# Patient Record
Sex: Female | Born: 1985 | Race: Asian | Hispanic: No | Marital: Married | Smoking: Never smoker
Health system: Southern US, Community
[De-identification: ages and names within clinical notes are randomized; demographics above are authoritative.]

## PROBLEM LIST (undated history)

## (undated) DIAGNOSIS — E785 Hyperlipidemia, unspecified: Secondary | ICD-10-CM

## (undated) DIAGNOSIS — E119 Type 2 diabetes mellitus without complications: Secondary | ICD-10-CM

## (undated) DIAGNOSIS — F419 Anxiety disorder, unspecified: Secondary | ICD-10-CM

## (undated) HISTORY — DX: Type 2 diabetes mellitus without complications: E11.9

## (undated) HISTORY — DX: Anxiety disorder, unspecified: F41.9

## (undated) HISTORY — DX: Hyperlipidemia, unspecified: E78.5

---

## 2017-08-08 ENCOUNTER — Encounter (HOSPITAL_COMMUNITY): Payer: Self-pay | Admitting: *Deleted

## 2017-08-08 ENCOUNTER — Emergency Department (HOSPITAL_COMMUNITY)
Admission: EM | Admit: 2017-08-08 | Discharge: 2017-08-08 | Disposition: A | Discharge status: Home / Self Care | Payer: Self-pay | Attending: Emergency Medicine | Admitting: Emergency Medicine

## 2017-08-08 ENCOUNTER — Emergency Department (HOSPITAL_COMMUNITY): Payer: Self-pay

## 2017-08-08 ENCOUNTER — Other Ambulatory Visit: Payer: Self-pay

## 2017-08-08 DIAGNOSIS — M7711 Lateral epicondylitis, right elbow: Secondary | ICD-10-CM | POA: Insufficient documentation

## 2017-08-08 DIAGNOSIS — R0602 Shortness of breath: Secondary | ICD-10-CM | POA: Insufficient documentation

## 2017-08-08 LAB — BASIC METABOLIC PANEL
ANION GAP: 10 (ref 5–15)
BUN: 13 mg/dL (ref 6–20)
CALCIUM: 9 mg/dL (ref 8.9–10.3)
CO2: 21 mmol/L — AB (ref 22–32)
Chloride: 105 mmol/L (ref 101–111)
Creatinine, Ser: 0.58 mg/dL (ref 0.44–1.00)
Glucose, Bld: 111 mg/dL — ABNORMAL HIGH (ref 65–99)
POTASSIUM: 4 mmol/L (ref 3.5–5.1)
Sodium: 136 mmol/L (ref 135–145)

## 2017-08-08 LAB — I-STAT TROPONIN, ED: TROPONIN I, POC: 0 ng/mL (ref 0.00–0.08)

## 2017-08-08 LAB — CBC
HCT: 44.3 % (ref 36.0–46.0)
Hemoglobin: 13.6 g/dL (ref 12.0–15.0)
MCH: 23.1 pg — ABNORMAL LOW (ref 26.0–34.0)
MCHC: 30.7 g/dL (ref 30.0–36.0)
MCV: 75.3 fL — ABNORMAL LOW (ref 78.0–100.0)
Platelets: 223 10*3/uL (ref 150–400)
RBC: 5.88 MIL/uL — AB (ref 3.87–5.11)
RDW: 15.5 % (ref 11.5–15.5)
WBC: 11.5 10*3/uL — ABNORMAL HIGH (ref 4.0–10.5)

## 2017-08-08 LAB — I-STAT BETA HCG BLOOD, ED (MC, WL, AP ONLY): I-stat hCG, quantitative: <5 m[IU]/mL (ref <5)

## 2017-08-08 MED ORDER — IBUPROFEN 600 MG PO TABS: 600.0000 mg | ORAL_TABLET | Freq: Four times a day (QID) | ORAL | 0 refills | Status: DC | PRN

## 2017-08-08 NOTE — ED Provider Notes (Signed)
[facility] Provider Note   CSN: [id number] Arrival date & time: 08/08/17  [id number]     History   Chief Complaint No chief complaint on file.   HPI Paula Delgado is a 32 y.o. female.  HPI   32 year old female presents today with complaints of right arm pain.  Patient reports she woke up in the middle night with pain to the right lateral elbow.  She notes pain is worse with movement of her wrist, she notes tingling in the hands, no loss of sensation strength or motor function.  She denies any trauma to the hand or upper extremity other than having to drag her sons place that from the car to the house.  She reports upper respiratory infection last week with minimal shortness of breath, no change since then, no productive cough fever or present chest pain.  Patient reports taking Tylenol prior to arrival.  History reviewed. No pertinent past medical history.  There are no active problems to display for this patient.   History reviewed. No pertinent surgical history.   OB History   None      Home Medications    Prior to Admission medications   Medication Sig Start Date End Date Taking? Authorizing Provider  ibuprofen (ADVIL,MOTRIN) 600 MG tablet Take 1 tablet (600 mg total) by mouth every 6 (six) hours as needed. 08/08/17   Eyvonne MechanicHedges, Dragan Tamburrino, PA-C    Family History No family history on file.  Social History Social History   Tobacco Use  . Smoking status: Never Smoker  . Smokeless tobacco: Never Used  Substance Use Topics  . Alcohol use: Yes  . Drug use: Never     Allergies   Patient has no known allergies.   Review of Systems Review of Systems  All other systems reviewed and are negative.    Physical Exam Updated Vital Signs BP 115/76   Pulse 83   Temp 98.9 F (37.2 C) (Oral)   Resp 20   Ht 5\' 2"  (1.575 m)   Wt 76.7 kg (169 lb)   SpO2 98%   BMI 30.91 kg/m   Physical Exam  Constitutional: She is oriented  to person, place, and time. She appears well-developed and well-nourished.  HENT:  Head: Normocephalic and atraumatic.  Eyes: Pupils are equal, round, and reactive to light. Conjunctivae are normal. Right eye exhibits no discharge. Left eye exhibits no discharge. No scleral icterus.  Neck: Normal range of motion. No JVD present. No tracheal deviation present.  Cardiovascular: Normal rate, regular rhythm, normal heart sounds and intact distal pulses. Exam reveals no gallop and no friction rub.  No murmur heard. Pulmonary/Chest: Effort normal and breath sounds normal. No stridor. No respiratory distress. She has no wheezes. She has no rales. She exhibits no tenderness.  Musculoskeletal:  Tenderness palpation of the proximal radius and lateral elbow pain worsened with wrist forearm supination no significant pain with pronation, grip strength 5 out of 5 radial pulse 2+ sensation intact  Neurological: She is alert and oriented to person, place, and time. Coordination normal.  Psychiatric: She has a normal mood and affect. Her behavior is normal. Judgment and thought content normal.  Nursing note and vitals reviewed.    ED Treatments / Results  Labs (all labs ordered are listed, but only abnormal results are displayed) Labs Reviewed  BASIC METABOLIC PANEL - Abnormal; Notable for the following components:      Result Value   CO2 21 (*)  Glucose, Bld 111 (*)    All other components within normal limits  CBC - Abnormal; Notable for the following components:   WBC 11.5 (*)    RBC 5.88 (*)    MCV 75.3 (*)    MCH 23.1 (*)    All other components within normal limits  I-STAT TROPONIN, ED  I-STAT BETA HCG BLOOD, ED ([facility], [facility], [facility] ONLY)    EKG None  Radiology Dg Chest 2 View  Result Date: 08/08/2017 CLINICAL DATA:  Shortness of breath.  Cold symptoms. EXAM: CHEST - 2 VIEW COMPARISON:  None. FINDINGS: The heart size and mediastinal contours are within normal limits. Both lungs are clear. The  visualized skeletal structures are unremarkable. IMPRESSION: No active cardiopulmonary disease. Electronically Signed   By: Elsie Stain M.D.   On: 08/08/2017 11:01    Procedures Procedures (including critical care time)  Medications Ordered in ED Medications - No data to display   Initial Impression / Assessment and Plan / ED Course  I have reviewed the triage vital signs and the nursing notes.  Pertinent labs & imaging results that were available during my care of the patient were reviewed by me and considered in my medical decision making (see chart for details).    Final Clinical Impressions(s) / ED Diagnoses   Final diagnoses:  Lateral epicondylitis of right elbow    Labs: I-STAT beta-hCG, i-STAT troponin, BMP, CBC  Imaging: DG chest 2 view, ED EKG no acute abnormalities  Consults:  Therapeutics:  Discharge Meds:   Assessment/Plan: 32 year old female presents today with likely lateral epicondylitis.  Patient with no signs of infectious etiology, no significant trauma.  Patient reported to nursing staff she had had episodes of shortness of breath, this is likely secondary to previous viral URI reassuring evaluation here negative chest x-ray oxygen 98%, afebrile with clear lung sounds.  Patient discharged with symptomatic care instructions and return precautions.  She verbalized understanding and agreement to today's plan.    ED Discharge Orders        Ordered    ibuprofen (ADVIL,MOTRIN) 600 MG tablet  Every 6 hours PRN     08/08/17 1320       Eyvonne Mechanic, PA-C 08/08/17 1320    Benjiman Core, MD 08/08/17 2212

## 2017-08-08 NOTE — ED Triage Notes (Signed)
Pt states here right arm has pain with movement since last night and states she took tylenol with no relieft.  Pt states last week her right chest and entire right side was hurting but did not go get checked out. Pt states went to [place] last week and states she had cold symptoms.  Pt reports sob. Pt states still has sob but no chest pain.  Right arm pain at lateral elbow and radiates down arm and has pain in right shoulder. No neuro deficits noted

## 2017-08-08 NOTE — Discharge Instructions (Signed)
Please read attached information. If you experience any new or worsening signs or symptoms please return to the emergency room for evaluation. Please follow-up with your primary care provider or specialist as discussed. Please use medication prescribed only as directed and discontinue taking if you have any concerning signs or symptoms.   °

## 2017-09-25 ENCOUNTER — Ambulatory Visit (INDEPENDENT_AMBULATORY_CARE_PROVIDER_SITE_OTHER): Payer: BLUE CROSS/BLUE SHIELD | Admitting: Obstetrics and Gynecology

## 2017-09-25 ENCOUNTER — Encounter: Payer: Self-pay | Admitting: Obstetrics and Gynecology

## 2017-09-25 VITALS — BP 118/78 | HR 72 | Ht 62.0 in | Wt 164.0 lb

## 2017-09-25 DIAGNOSIS — Z01419 Encounter for gynecological examination (general) (routine) without abnormal findings: Secondary | ICD-10-CM

## 2017-09-25 DIAGNOSIS — Z124 Encounter for screening for malignant neoplasm of cervix: Secondary | ICD-10-CM | POA: Diagnosis not present

## 2017-09-25 DIAGNOSIS — Z1151 Encounter for screening for human papillomavirus (HPV): Secondary | ICD-10-CM | POA: Diagnosis not present

## 2017-09-25 DIAGNOSIS — Z113 Encounter for screening for infections with a predominantly sexual mode of transmission: Secondary | ICD-10-CM | POA: Diagnosis not present

## 2017-09-25 NOTE — Patient Instructions (Signed)
Calorie Counting for Weight Loss Calories are units of energy. Your body needs a certain amount of calories from food to keep you going throughout the day. When you eat more calories than your body needs, your body stores the extra calories as fat. When you eat fewer calories than your body needs, your body burns fat to get the energy it needs. Calorie counting means keeping track of how many calories you eat and drink each day. Calorie counting can be helpful if you need to lose weight. If you make sure to eat fewer calories than your body needs, you should lose weight. Ask your health care provider what a healthy weight is for you. For calorie counting to work, you will need to eat the right number of calories in a day in order to lose a healthy amount of weight per week. A dietitian can help you determine how many calories you need in a day and will give you suggestions on how to reach your calorie goal.  A healthy amount of weight to lose per week is usually 1-2 lb (0.5-0.9 kg). This usually means that your daily calorie intake should be reduced by 500-750 calories.  Eating 1,200 - 1,500 calories per day can help most women lose weight.  Eating 1,500 - 1,800 calories per day can help most men lose weight.  What is my plan? My goal is to have __________ calories per day. If I have this many calories per day, I should lose around __________ pounds per week. What do I need to know about calorie counting? In order to meet your daily calorie goal, you will need to:  Find out how many calories are in each food you would like to eat. Try to do this before you eat.  Decide how much of the food you plan to eat.  Write down what you ate and how many calories it had. Doing this is called keeping a food log.  To successfully lose weight, it is important to balance calorie counting with a healthy lifestyle that includes regular activity. Aim for 150 minutes of moderate exercise (such as walking) or 75  minutes of vigorous exercise (such as running) each week. Where do I find calorie information?  The number of calories in a food can be found on a Nutrition Facts label. If a food does not have a Nutrition Facts label, try to look up the calories online or ask your dietitian for help. Remember that calories are listed per serving. If you choose to have more than one serving of a food, you will have to multiply the calories per serving by the amount of servings you plan to eat. For example, the label on a package of bread might say that a serving size is 1 slice and that there are 90 calories in a serving. If you eat 1 slice, you will have eaten 90 calories. If you eat 2 slices, you will have eaten 180 calories. How do I keep a food log? Immediately after each meal, record the following information in your food log:  What you ate. Don't forget to include toppings, sauces, and other extras on the food.  How much you ate. This can be measured in cups, ounces, or number of items.  How many calories each food and drink had.  The total number of calories in the meal.  Keep your food log near you, such as in a small notebook in your pocket, or use a mobile app or website. Some   programs will calculate calories for you and show you how many calories you have left for the day to meet your goal. What are some calorie counting tips?  Use your calories on foods and drinks that will fill you up and not leave you hungry: ? Some examples of foods that fill you up are nuts and nut butters, vegetables, lean proteins, and high-fiber foods like whole grains. High-fiber foods are foods with more than 5 g fiber per serving. ? Drinks such as sodas, specialty coffee drinks, alcohol, and juices have a lot of calories, yet do not fill you up.  Eat nutritious foods and avoid empty calories. Empty calories are calories you get from foods or beverages that do not have many vitamins or protein, such as candy, sweets, and  soda. It is better to have a nutritious high-calorie food (such as an avocado) than a food with few nutrients (such as a bag of chips).  Know how many calories are in the foods you eat most often. This will help you calculate calorie counts faster.  Pay attention to calories in drinks. Low-calorie drinks include water and unsweetened drinks.  Pay attention to nutrition labels for "low fat" or "fat free" foods. These foods sometimes have the same amount of calories or more calories than the full fat versions. They also often have added sugar, starch, or salt, to make up for flavor that was removed with the fat.  Find a way of tracking calories that works for you. Get creative. Try different apps or programs if writing down calories does not work for you. What are some portion control tips?  Know how many calories are in a serving. This will help you know how many servings of a certain food you can have.  Use a measuring cup to measure serving sizes. You could also try weighing out portions on a kitchen scale. With time, you will be able to estimate serving sizes for some foods.  Take some time to put servings of different foods on your favorite plates, bowls, and cups so you know what a serving looks like.  Try not to eat straight from a bag or box. Doing this can lead to overeating. Put the amount you would like to eat in a cup or on a plate to make sure you are eating the right portion.  Use smaller plates, glasses, and bowls to prevent overeating.  Try not to multitask (for example, watch TV or use your computer) while eating. If it is time to eat, sit down at a table and enjoy your food. This will help you to know when you are full. It will also help you to be aware of what you are eating and how much you are eating. What are tips for following this plan? Reading food labels  Check the calorie count compared to the serving size. The serving size may be smaller than what you are used to  eating.  Check the source of the calories. Make sure the food you are eating is high in vitamins and protein and low in saturated and trans fats. Shopping  Read nutrition labels while you shop. This will help you make healthy decisions before you decide to purchase your food.  Make a grocery list and stick to it. Cooking  Try to cook your favorite foods in a healthier way. For example, try baking instead of frying.  Use low-fat dairy products. Meal planning  Use more fruits and vegetables. Half of your plate should   be fruits and vegetables.  Include lean proteins like poultry and fish. How do I count calories when eating out?  Ask for smaller portion sizes.  Consider sharing an entree and sides instead of getting your own entree.  If you get your own entree, eat only half. Ask for a box at the beginning of your meal and put the rest of your entree in it so you are not tempted to eat it.  If calories are listed on the menu, choose the lower calorie options.  Choose dishes that include vegetables, fruits, whole grains, low-fat dairy products, and lean protein.  Choose items that are boiled, broiled, grilled, or steamed. Stay away from items that are buttered, battered, fried, or served with cream sauce. Items labeled "crispy" are usually fried, unless stated otherwise.  Choose water, low-fat milk, unsweetened iced tea, or other drinks without added sugar. If you want an alcoholic beverage, choose a lower calorie option such as a glass of wine or light beer.  Ask for dressings, sauces, and syrups on the side. These are usually high in calories, so you should limit the amount you eat.  If you want a salad, choose a garden salad and ask for grilled meats. Avoid extra toppings like bacon, cheese, or fried items. Ask for the dressing on the side, or ask for olive oil and vinegar or lemon to use as dressing.  Estimate how many servings of a food you are given. For example, a serving of  cooked rice is  cup or about the size of half a baseball. Knowing serving sizes will help you be aware of how much food you are eating at restaurants. The list below tells you how big or small some common portion sizes are based on everyday objects: ? 1 oz-4 stacked dice. ? 3 oz-1 deck of cards. ? 1 tsp-1 die. ? 1 Tbsp- a ping-pong ball. ? 2 Tbsp-1 ping-pong ball. ?  cup- baseball. ? 1 cup-1 baseball. Summary  Calorie counting means keeping track of how many calories you eat and drink each day. If you eat fewer calories than your body needs, you should lose weight.  A healthy amount of weight to lose per week is usually 1-2 lb (0.5-0.9 kg). This usually means reducing your daily calorie intake by 500-750 calories.  The number of calories in a food can be found on a Nutrition Facts label. If a food does not have a Nutrition Facts label, try to look up the calories online or ask your dietitian for help.  Use your calories on foods and drinks that will fill you up, and not on foods and drinks that will leave you hungry.  Use smaller plates, glasses, and bowls to prevent overeating. This information is not intended to replace advice given to you by your health care provider. Make sure you discuss any questions you have with your health care provider. Document Released: 05/02/2005 Document Revised: 04/01/2016 Document Reviewed: 04/01/2016 Elsevier Interactive Patient Education  2018 [employer]  Exercising to Lose Weight Exercising can help you to lose weight. In order to lose weight through exercise, you need to do vigorous-intensity exercise. You can tell that you are exercising with vigorous intensity if you are breathing very hard and fast and cannot hold a conversation while exercising. Moderate-intensity exercise helps to maintain your current weight. You can tell that you are exercising at a moderate level if you have a higher heart rate and faster breathing, but you are still able  to   hold a conversation. How often should I exercise? Choose an activity that you enjoy and set realistic goals. Your health care provider can help you to make an activity plan that works for you. Exercise regularly as directed by your health care provider. This may include:  Doing resistance training twice each week, such as: ? Push-ups. ? Sit-ups. ? Lifting weights. ? Using resistance bands.  Doing a given intensity of exercise for a given amount of time. Choose from these options: ? 150 minutes of moderate-intensity exercise every week. ? 75 minutes of vigorous-intensity exercise every week. ? A mix of moderate-intensity and vigorous-intensity exercise every week.  Children, pregnant women, people who are out of shape, people who are overweight, and older adults may need to consult a health care provider for individual recommendations. If you have any sort of medical condition, be sure to consult your health care provider before starting a new exercise program. What are some activities that can help me to lose weight?  Walking at a rate of at least 4.5 miles an hour.  Jogging or running at a rate of 5 miles per hour.  Biking at a rate of at least 10 miles per hour.  Lap swimming.  Roller-skating or in-line skating.  Cross-country skiing.  Vigorous competitive sports, such as football, basketball, and soccer.  Jumping rope.  Aerobic dancing. How can I be more active in my day-to-day activities?  Use the stairs instead of the elevator.  Take a walk during your lunch break.  If you drive, park your car farther away from work or school.  If you take public transportation, get off one stop early and walk the rest of the way.  Make all of your phone calls while standing up and walking around.  Get up, stretch, and walk around every 30 minutes throughout the day. What guidelines should I follow while exercising?  Do not exercise so much that you hurt yourself, feel dizzy,  or get very short of breath.  Consult your health care provider prior to starting a new exercise program.  Wear comfortable clothes and shoes with good support.  Drink plenty of water while you exercise to prevent dehydration or heat stroke. Body water is lost during exercise and must be replaced.  Work out until you breathe faster and your heart beats faster. This information is not intended to replace advice given to you by your health care provider. Make sure you discuss any questions you have with your health care provider. Document Released: 06/04/2010 Document Revised: 10/08/2015 Document Reviewed: 10/03/2013 Elsevier Interactive Patient Education  2018 [employer] Inc.  

## 2017-09-25 NOTE — Progress Notes (Signed)
Pt is in the office for annual exam, currently has Mirena IUD, place in 2018. Pt desires std testing.

## 2017-09-25 NOTE — Progress Notes (Signed)
Subjective:     Paula Delgado is a 32 y.o. female G2P0111 with BMI 30 and Mirena induced amneorrhea (inserted in 2018) who is here for a comprehensive physical exam. The patient reports no problems. Patient is sexually active without complaints. She denies any pelvic pain or abnormal discharge.  History reviewed. No pertinent past medical history. History reviewed. No pertinent surgical history. Family History  Problem Relation Age of Onset  . Diabetes Mother     Social History   Socioeconomic History  . Marital status: Single    Spouse name: Not on file  . Number of children: Not on file  . Years of education: Not on file  . Highest education level: Not on file  Occupational History  . Not on file  Social Needs  . Financial resource strain: Not on file  . Food insecurity:    Worry: Not on file    Inability: Not on file  . Transportation needs:    Medical: Not on file    Non-medical: Not on file  Tobacco Use  . Smoking status: Former Smoker    Types: Cigarettes  . Smokeless tobacco: Never Used  Substance and Sexual Activity  . Alcohol use: Yes  . Drug use: Never  . Sexual activity: Yes    Birth control/protection: IUD  Lifestyle  . Physical activity:    Days per week: Not on file    Minutes per session: Not on file  . Stress: Not on file  Relationships  . Social connections:    Talks on phone: Not on file    Gets together: Not on file    Attends religious service: Not on file    Active member of club or organization: Not on file    Attends meetings of clubs or organizations: Not on file    Relationship status: Not on file  . Intimate partner violence:    Fear of current or ex partner: Not on file    Emotionally abused: Not on file    Physically abused: Not on file    Forced sexual activity: Not on file  Other Topics Concern  . Not on file  Social History Narrative  . Not on file   Health Maintenance  Topic Date Due  . HIV Screening  02/15/1999  .  TETANUS/TDAP  02/15/2003  . PAP SMEAR  02/14/2005  . INFLUENZA VACCINE  12/14/2017       Review of Systems Pertinent items are noted in HPI.   Objective:  Blood pressure 118/78, pulse 72, height  (1.575 m), weight 164 lb (74.4 kg).     GENERAL: Well-developed, well-nourished female in no acute distress.  HEENT: Normocephalic, atraumatic. Sclerae anicteric.  NECK: Supple. Normal thyroid.  LUNGS: Clear to auscultation bilaterally.  HEART: Regular rate and rhythm. BREASTS: Symmetric in size. No palpable masses or lymphadenopathy, skin changes, or nipple drainage. ABDOMEN: Soft, nontender, nondistended. No organomegaly. PELVIC: Normal external female genitalia. Vagina is pink and rugated.  Normal discharge. Normal appearing cervix with IUD strings visualized from os. Uterus is normal in size. No adnexal mass or tenderness. EXTREMITIES: No cyanosis, clubbing, or edema, 2+ distal pulses.    Assessment:    Healthy female exam.      Plan:    Pap smear and STI screen performed Health maintenance labs ordered Patient will be contacted with abnormal results Discussed weight loss strategies See After Visit Summary for Counseling Recommendations

## 2017-09-26 LAB — CBC
HEMATOCRIT: 41.6 % (ref 34.0–46.6)
Hemoglobin: 12.6 g/dL (ref 11.1–15.9)
MCH: 23.9 pg — ABNORMAL LOW (ref 26.6–33.0)
MCHC: 30.3 g/dL — ABNORMAL LOW (ref 31.5–35.7)
MCV: 79 fL (ref 79–97)
PLATELETS: 270 10*3/uL (ref 150–379)
RBC: 5.28 x10E6/uL (ref 3.77–5.28)
RDW: 15.6 % — ABNORMAL HIGH (ref 12.3–15.4)
WBC: 6.4 10*3/uL (ref 3.4–10.8)

## 2017-09-26 LAB — COMPREHENSIVE METABOLIC PANEL
A/G RATIO: 1.7 (ref 1.2–2.2)
ALT: 16 IU/L (ref 0–32)
AST: 18 IU/L (ref 0–40)
Albumin: 4.8 g/dL (ref 3.5–5.5)
Alkaline Phosphatase: 56 IU/L (ref 39–117)
BUN/Creatinine Ratio: 19 (ref 9–23)
BUN: 15 mg/dL (ref 6–20)
Bilirubin Total: <0.2 mg/dL (ref 0.0–1.2)
CALCIUM: 9.6 mg/dL (ref 8.7–10.2)
CO2: 24 mmol/L (ref 20–29)
CREATININE: 0.77 mg/dL (ref 0.57–1.00)
Chloride: 102 mmol/L (ref 96–106)
GFR calc Af Amer: 117 mL/min/{1.73_m2} (ref >59)
GFR calc non Af Amer: 102 mL/min/{1.73_m2} (ref >59)
Globulin, Total: 2.8 g/dL (ref 1.5–4.5)
Glucose: 101 mg/dL — ABNORMAL HIGH (ref 65–99)
POTASSIUM: 4.3 mmol/L (ref 3.5–5.2)
Sodium: 141 mmol/L (ref 134–144)
TOTAL PROTEIN: 7.6 g/dL (ref 6.0–8.5)

## 2017-09-26 LAB — HEPATITIS B SURFACE ANTIGEN: Hepatitis B Surface Ag: NEGATIVE

## 2017-09-26 LAB — HEMOGLOBIN A1C
Est. average glucose Bld gHb Est-mCnc: 143 mg/dL
Hgb A1c MFr Bld: 6.6 % — ABNORMAL HIGH (ref 4.8–5.6)

## 2017-09-26 LAB — RPR: RPR: NONREACTIVE

## 2017-09-26 LAB — HEPATITIS C ANTIBODY: Hep C Virus Ab: <0.1 s/co ratio (ref 0.0–0.9)

## 2017-09-26 LAB — TSH: TSH: 1.29 u[IU]/mL (ref 0.450–4.500)

## 2017-09-26 LAB — HIV ANTIBODY (ROUTINE TESTING W REFLEX): HIV Screen 4th Generation wRfx: NONREACTIVE

## 2017-09-27 LAB — CYTOLOGY - PAP
Candida vaginitis: NEGATIVE
Chlamydia: NEGATIVE
Diagnosis: NEGATIVE
HPV (WINDOPATH): NOT DETECTED
Neisseria Gonorrhea: NEGATIVE
TRICH (WINDOWPATH): NEGATIVE

## 2019-03-06 ENCOUNTER — Other Ambulatory Visit: Payer: Self-pay

## 2019-03-06 ENCOUNTER — Encounter: Payer: Self-pay | Admitting: Family Medicine

## 2019-03-06 ENCOUNTER — Ambulatory Visit (INDEPENDENT_AMBULATORY_CARE_PROVIDER_SITE_OTHER): Payer: BLUE CROSS/BLUE SHIELD | Admitting: Family Medicine

## 2019-03-06 VITALS — BP 128/80 | HR 61 | Temp 98.1°F | Ht 62.0 in | Wt 161.0 lb

## 2019-03-06 DIAGNOSIS — Z0000 Encounter for general adult medical examination without abnormal findings: Secondary | ICD-10-CM

## 2019-03-06 DIAGNOSIS — Z23 Encounter for immunization: Secondary | ICD-10-CM

## 2019-03-06 DIAGNOSIS — Z8632 Personal history of gestational diabetes: Secondary | ICD-10-CM

## 2019-03-06 LAB — CBC
HCT: 44.3 % (ref 36.0–46.0)
Hemoglobin: 13.8 g/dL (ref 12.0–15.0)
MCHC: 31.2 g/dL (ref 30.0–36.0)
MCV: 81.6 fl (ref 78.0–100.0)
Platelets: 223 10*3/uL (ref 150.0–400.0)
RBC: 5.43 Mil/uL — ABNORMAL HIGH (ref 3.87–5.11)
RDW: 14.5 % (ref 11.5–15.5)
WBC: 6.6 10*3/uL (ref 4.0–10.5)

## 2019-03-06 LAB — BASIC METABOLIC PANEL
BUN: 17 mg/dL (ref 6–23)
CO2: 28 mEq/L (ref 19–32)
Calcium: 9.5 mg/dL (ref 8.4–10.5)
Chloride: 103 mEq/L (ref 96–112)
Creatinine, Ser: 0.61 mg/dL (ref 0.40–1.20)
GFR: 111.58 mL/min (ref >60.00)
Glucose, Bld: 113 mg/dL — ABNORMAL HIGH (ref 70–99)
Potassium: 4.3 mEq/L (ref 3.5–5.1)
Sodium: 139 mEq/L (ref 135–145)

## 2019-03-06 LAB — LIPID PANEL
Cholesterol: 196 mg/dL (ref 0–200)
HDL: 53.5 mg/dL (ref >39.00)
LDL Cholesterol: 129 mg/dL — ABNORMAL HIGH (ref 0–99)
NonHDL: 142.07
Total CHOL/HDL Ratio: 4
Triglycerides: 65 mg/dL (ref 0.0–149.0)
VLDL: 13 mg/dL (ref 0.0–40.0)

## 2019-03-06 LAB — ALT: ALT: 13 U/L (ref 0–35)

## 2019-03-06 LAB — AST: AST: 16 U/L (ref 0–37)

## 2019-03-06 LAB — HEMOGLOBIN A1C: Hgb A1c MFr Bld: 6.7 % — ABNORMAL HIGH (ref 4.6–6.5)

## 2019-03-06 NOTE — Patient Instructions (Signed)
Health Maintenance, Female Adopting a healthy lifestyle and getting preventive care are important in promoting health and wellness. Ask your health care provider about:  The right schedule for you to have regular tests and exams.  Things you can do on your own to prevent diseases and keep yourself healthy. What should I know about diet, weight, and exercise? Eat a healthy diet   Eat a diet that includes plenty of vegetables, fruits, low-fat dairy products, and lean protein.  Do not eat a lot of foods that are high in solid fats, added sugars, or sodium. Maintain a healthy weight Body mass index (BMI) is used to identify weight problems. It estimates body fat based on height and weight. Your health care provider can help determine your BMI and help you achieve or maintain a healthy weight. Get regular exercise Get regular exercise. This is one of the most important things you can do for your health. Most adults should:  Exercise for at least 150 minutes each week. The exercise should increase your heart rate and make you sweat (moderate-intensity exercise).  Do strengthening exercises at least twice a week. This is in addition to the moderate-intensity exercise.  Spend less time sitting. Even light physical activity can be beneficial. Watch cholesterol and blood lipids Have your blood tested for lipids and cholesterol at 35 years of age, then have this test every 5 years. Have your cholesterol levels checked more often if:  Your lipid or cholesterol levels are high.  You are older than 35 years of age.  You are at high risk for heart disease. What should I know about cancer screening? Depending on your health history and family history, you may need to have cancer screening at various ages. This may include screening for:  Breast cancer.  Cervical cancer.  Colorectal cancer.  Skin cancer.  Lung cancer. What should I know about heart disease, diabetes, and high blood  pressure? Blood pressure and heart disease  High blood pressure causes heart disease and increases the risk of stroke. This is more likely to develop in people who have high blood pressure readings, are of African descent, or are overweight.  Have your blood pressure checked: ? Every 3-5 years if you are 18-39 years of age. ? Every year if you are 40 years old or older. Diabetes Have regular diabetes screenings. This checks your fasting blood sugar level. Have the screening done:  Once every three years after age 40 if you are at a normal weight and have a low risk for diabetes.  More often and at a younger age if you are overweight or have a high risk for diabetes. What should I know about preventing infection? Hepatitis B If you have a higher risk for hepatitis B, you should be screened for this virus. Talk with your health care provider to find out if you are at risk for hepatitis B infection. Hepatitis C Testing is recommended for:  Everyone born from 1945 through 1965.  Anyone with known risk factors for hepatitis C. Sexually transmitted infections (STIs)  Get screened for STIs, including gonorrhea and chlamydia, if: ? You are sexually active and are younger than 35 years of age. ? You are older than 35 years of age and your health care provider tells you that you are at risk for this type of infection. ? Your sexual activity has changed since you were last screened, and you are at increased risk for chlamydia or gonorrhea. Ask your health care provider if   you are at risk.  Ask your health care provider about whether you are at high risk for HIV. Your health care provider may recommend a prescription medicine to help prevent HIV infection. If you choose to take medicine to prevent HIV, you should first get tested for HIV. You should then be tested every 3 months for as long as you are taking the medicine. Pregnancy  If you are about to stop having your period (premenopausal) and  you may become pregnant, seek counseling before you get pregnant.  Take 400 to 800 micrograms (mcg) of folic acid every day if you become pregnant.  Ask for birth control (contraception) if you want to prevent pregnancy. Osteoporosis and menopause Osteoporosis is a disease in which the bones lose minerals and strength with aging. This can result in bone fractures. If you are 65 years old or older, or if you are at risk for osteoporosis and fractures, ask your health care provider if you should:  Be screened for bone loss.  Take a calcium or vitamin D supplement to lower your risk of fractures.  Be given hormone replacement therapy (HRT) to treat symptoms of menopause. Follow these instructions at home: Lifestyle  Do not use any products that contain nicotine or tobacco, such as cigarettes, e-cigarettes, and chewing tobacco. If you need help quitting, ask your health care provider.  Do not use street drugs.  Do not share needles.  Ask your health care provider for help if you need support or information about quitting drugs. Alcohol use  Do not drink alcohol if: ? Your health care provider tells you not to drink. ? You are pregnant, may be pregnant, or are planning to become pregnant.  If you drink alcohol: ? Limit how much you use to 0-1 drink a day. ? Limit intake if you are breastfeeding.  Be aware of how much alcohol is in your drink. In the [place] one drink equals one 12 oz bottle of beer (355 mL), one 5 oz glass of wine (148 mL), or one 1 oz glass of hard liquor (44 mL). General instructions  Schedule regular health, dental, and eye exams.  Stay current with your vaccines.  Tell your health care provider if: ? You often feel depressed. ? You have ever been abused or do not feel safe at home. Summary  Adopting a healthy lifestyle and getting preventive care are important in promoting health and wellness.  Follow your health care provider's instructions about healthy  diet, exercising, and getting tested or screened for diseases.  Follow your health care provider's instructions on monitoring your cholesterol and blood pressure. This information is not intended to replace advice given to you by your health care provider. Make sure you discuss any questions you have with your health care provider. Document Released: 11/15/2010 Document Revised: 04/25/2018 Document Reviewed: 04/25/2018 Elsevier Patient Education  2020 Elsevier Inc.  

## 2019-03-06 NOTE — Progress Notes (Signed)
Paula Delgado is a 33 y.o. female  Chief Complaint  Patient presents with  . Establish Care  . Annual Exam    HPI: Paula Delgado is a 33 y.o. female here to establish care and for annual CPE, fasting labs. She has a 40 year old son. She moved to [facility] from [facility] 2 years ago but has not had PCP here.  Pt states she was on metformin during pregnancy (2 years ago) but normal BS post delivery.  She would like a flu vaccine today.  Last PAP: UTD - follows with GYN Dr. Mora Bellman  Diet/Exercise: some exercise, diet varies Vision - UTD on exam Dentist - due  Med refills needed today? none   History reviewed. No pertinent past medical history.  History reviewed. No pertinent surgical history.  Social History   Socioeconomic History  . Marital status: Single    Spouse name: Not on file  . Number of children: Not on file  . Years of education: Not on file  . Highest education level: Not on file  Occupational History  . Not on file  Social Needs  . Financial resource strain: Not on file  . Food insecurity    Worry: Not on file    Inability: Not on file  . Transportation needs    Medical: Not on file    Non-medical: Not on file  Tobacco Use  . Smoking status: Former Smoker    Types: Cigarettes  . Smokeless tobacco: Never Used  Substance and Sexual Activity  . Alcohol use: Yes  . Drug use: Never  . Sexual activity: Yes    Birth control/protection: I.U.D.  Lifestyle  . Physical activity    Days per week: Not on file    Minutes per session: Not on file  . Stress: Not on file  Relationships  . Social Herbalist on phone: Not on file    Gets together: Not on file    Attends religious service: Not on file    Active member of club or organization: Not on file    Attends meetings of clubs or organizations: Not on file    Relationship status: Not on file  . Intimate partner violence    Fear of current or ex partner: Not on file    Emotionally  abused: Not on file    Physically abused: Not on file    Forced sexual activity: Not on file  Other Topics Concern  . Not on file  Social History Narrative  . Not on file    Family History  Problem Relation Age of Onset  . Diabetes Mother       There is no immunization history on file for this patient.  Outpatient Encounter Medications as of 03/06/2019  Medication Sig  . [DISCONTINUED] ibuprofen (ADVIL,MOTRIN) 600 MG tablet Take 1 tablet (600 mg total) by mouth every 6 (six) hours as needed. (Patient not taking: Reported on 09/25/2017)   No facility-administered encounter medications on file as of 03/06/2019.      ROS: Gen: no fever, chills  Skin: no rash, itching ENT: no ear pain, ear drainage, nasal congestion, rhinorrhea, sinus pressure, sore throat Eyes: no blurry vision, double vision Resp: no cough, wheeze,SOB Breast: no breast tenderness, no nipple discharge, no breast masses CV: no CP, palpitations, LE edema,  GI: no heartburn, n/v/d/c, abd pain GU: no dysuria, urgency, frequency, hematuria; no vaginal itching, odor, discharge MSK: no joint pain, myalgias, back pain Neuro: no dizziness,  headache, weakness, vertigo Psych: no depression, anxiety, insomnia   No Known Allergies  BP 128/80   Pulse 61   Temp 98.1 F (36.7 C) (Tympanic)   Ht 5\' 2"  (1.575 m)   Wt 161 lb (73 kg)   SpO2 97%   BMI 29.45 kg/m   Physical Exam  Constitutional: She is oriented to person, place, and time. She appears well-developed and well-nourished. No distress.  HENT:  Head: Normocephalic and atraumatic.  Right Ear: Tympanic membrane and ear canal normal.  Left Ear: Tympanic membrane and ear canal normal.  Nose: Nose normal.  Mouth/Throat: Oropharynx is clear and moist and mucous membranes are normal.  Eyes: Pupils are equal, round, and reactive to light. Conjunctivae are normal.  Neck: Neck supple. No thyromegaly present.  Cardiovascular: Normal rate, regular rhythm, normal  heart sounds and intact distal pulses.  No murmur heard. Pulmonary/Chest: Effort normal and breath sounds normal. No respiratory distress. She has no wheezes. She has no rhonchi.  Abdominal: Soft. Bowel sounds are normal. She exhibits no distension and no mass. There is no abdominal tenderness.  Musculoskeletal:        General: No edema.  Lymphadenopathy:    She has no cervical adenopathy.  Neurological: She is alert and oriented to person, place, and time. She exhibits normal muscle tone. Coordination normal.  Skin: Skin is warm and dry.  Psychiatric: She has a normal mood and affect. Her behavior is normal.     A/P:  1. Annual physical exam - PAP UTD - discussed importance of regular CV exercise, healthy diet, adequate sleep - vision exam UTD, pt needs to schedule dental exam - ALT - AST - Basic metabolic panel - CBC - Lipid panel - next CPE in 1 year   2. History of gestational diabetes - Hemoglobin A1c  3. Need for influenza vaccination - Flu Vaccine QUAD 6+ mos PF IM (Fluarix Quad PF)

## 2019-03-13 ENCOUNTER — Encounter: Payer: Self-pay | Admitting: Family Medicine

## 2019-03-13 ENCOUNTER — Telehealth (INDEPENDENT_AMBULATORY_CARE_PROVIDER_SITE_OTHER): Payer: BLUE CROSS/BLUE SHIELD | Admitting: Family Medicine

## 2019-03-13 ENCOUNTER — Other Ambulatory Visit: Payer: Self-pay

## 2019-03-13 VITALS — Ht 62.0 in

## 2019-03-13 DIAGNOSIS — E119 Type 2 diabetes mellitus without complications: Secondary | ICD-10-CM | POA: Diagnosis not present

## 2019-03-13 MED ORDER — METFORMIN HCL ER 500 MG PO TB24: 500.0000 mg | ORAL_TABLET | Freq: Every day | ORAL | 3 refills | Status: DC

## 2019-03-13 NOTE — Progress Notes (Signed)
Virtual Visit via Video Note  I connected with Paula Delgado on 03/13/19 at  8:30 AM EDT by a video enabled telemedicine application and verified that I am speaking with the correct person using two identifiers. Location patient: home Location provider: work  Persons participating in the virtual visit: patient, provider  I discussed the limitations of evaluation and management by telemedicine and the availability of in person appointments. The patient expressed understanding and agreed to proceed.  Chief Complaint  Patient presents with  . Follow-up    on labs     HPI: Paula Delgado is a 33 y.o. female to f/u on recent labs which showed an A1C of 6.7, previously 6.6 in 09/2017. Pt states she had gestational diabetes and took metformin during her pregnancy. She does not exercise, does not eat consistent meals, does not follow low sugar/low carb diet. Her mother has DM. Pt is agreeable to restarting metformin 500mg  daily. She will also start taking a daily ASA 81mg . She does not want to take a statin or ACEi/ARB at this time.  Lab Results  Component Value Date   HGBA1C 6.7 (H) 03/06/2019   Lab Results  Component Value Date   CHOL 196 03/06/2019   HDL 53.50 03/06/2019   LDLCALC 129 (H) 03/06/2019   TRIG 65.0 03/06/2019   CHOLHDL 4 03/06/2019     History reviewed. No pertinent past medical history.  History reviewed. No pertinent surgical history.  Family History  Problem Relation Age of Onset  . Diabetes Mother     Social History   Tobacco Use  . Smoking status: Former Smoker    Types: Cigarettes  . Smokeless tobacco: Never Used  Substance Use Topics  . Alcohol use: Yes  . Drug use: Never     Current Outpatient Medications:  .  metFORMIN (GLUCOPHAGE XR) 500 MG 24 hr tablet, Take 1 tablet (500 mg total) by mouth daily with breakfast., Disp: 90 tablet, Rfl: 3  No Known Allergies    ROS: See pertinent positives and negatives per  HPI.   EXAM:  VITALS per patient if applicable:  GENERAL: alert, oriented, appears well and in no acute distress  HEENT: atraumatic, conjunctiva clear, no obvious abnormalities on inspection of external nose and ears  NECK: normal movements of the head and neck  LUNGS: on inspection no signs of respiratory distress, breathing rate appears normal, no obvious gross SOB, gasping or wheezing, no conversational dyspnea  CV: no obvious cyanosis  MS: moves all visible extremities without noticeable abnormality  PSYCH/NEURO: pleasant and cooperative, speech and thought processing grossly intact   ASSESSMENT AND PLAN:  1. Controlled type 2 diabetes mellitus without complication, without long-term current use of insulin (HCC) - A1C = 6.7 (previously 6.6) - start ASA 81mg  daily Rx: - metFORMIN (GLUCOPHAGE XR) 500 MG 24 hr tablet; Take 1 tablet (500 mg total) by mouth daily with breakfast.  Dispense: 90 tablet; Refill: 3 - pt declines Rx for low dose statin and ACE - discussed importance of regular CV exercise and adhering to diabetic diet - vision exam UTD but did not have diabetic eye exam - recommended pt schedule - f/u in 3 mo or sooner PRN    I discussed the assessment and treatment plan with the patient. The patient was provided an opportunity to ask questions and all were answered. The patient agreed with the plan and demonstrated an understanding of the instructions.   The patient was advised to call back or seek an in-person evaluation  if the symptoms worsen or if the condition fails to improve as anticipated.   Letta Median, DO

## 2019-05-13 ENCOUNTER — Ambulatory Visit: Payer: BLUE CROSS/BLUE SHIELD | Attending: Internal Medicine

## 2019-05-13 DIAGNOSIS — Z20822 Contact with and (suspected) exposure to covid-19: Secondary | ICD-10-CM

## 2019-05-15 LAB — NOVEL CORONAVIRUS, NAA: SARS-CoV-2, NAA: NOT DETECTED

## 2019-08-02 ENCOUNTER — Ambulatory Visit: Payer: BLUE CROSS/BLUE SHIELD | Admitting: Family Medicine

## 2019-08-09 ENCOUNTER — Encounter: Payer: Self-pay | Admitting: Family Medicine

## 2019-08-09 ENCOUNTER — Other Ambulatory Visit: Payer: Self-pay

## 2019-08-09 ENCOUNTER — Ambulatory Visit: Payer: BLUE CROSS/BLUE SHIELD | Admitting: Family Medicine

## 2019-08-09 VITALS — BP 100/78 | HR 88 | Temp 98.2°F | Ht 62.0 in | Wt 166.4 lb

## 2019-08-09 DIAGNOSIS — E119 Type 2 diabetes mellitus without complications: Secondary | ICD-10-CM

## 2019-08-09 LAB — HEMOGLOBIN A1C: Hgb A1c MFr Bld: 6.9 % — ABNORMAL HIGH (ref 4.6–6.5)

## 2019-08-09 NOTE — Progress Notes (Signed)
Chief Complaint  Patient presents with  . Diabetes    Follow up.  Pt is not fasting for labs today.    HPI: *Paula Delgado is a 34 y.o. female here for DM follow-up. She has no concerns or complaints today.   Pt does not check BS at home.  Hypoglycemia/Hypergylcemic episodes: no  Diet: trying keto diet, more veggies, no soda Exercise: recently joined a gym  Denies HA, CP, SOB, DOE, LE edema, dizziness, n/v/d/c.  Lab Results  Component Value Date   HGBA1C 6.7 (H) 03/06/2019   No results found for: Concepc[place] Lab Results  Component Value Date   CREATININE 0.61 03/06/2019   Lab Results  Component Value Date   CHOL 196 03/06/2019   HDL 53.50 03/06/2019   LDLCALC 129 (H) 03/06/2019   TRIG 65.0 03/06/2019   CHOLHDL 4 03/06/2019    History reviewed. No pertinent past medical history.  History reviewed. No pertinent surgical history.  Social History   Socioeconomic History  . Marital status: Single    Spouse name: Not on file  . Number of children: Not on file  . Years of education: Not on file  . Highest education level: Not on file  Occupational History  . Not on file  Tobacco Use  . Smoking status: Never Smoker  . Smokeless tobacco: Never Used  Substance and Sexual Activity  . Alcohol use: Yes  . Drug use: Never  . Sexual activity: Yes    Birth control/protection: I.U.D.  Other Topics Concern  . Not on file  Social History Narrative  . Not on file   Social Determinants of Health   Financial Resource Strain:   . Difficulty of Paying Living Expenses:   Food Insecurity:   . Worried About Programme researcher, broadcasting/film/video in the Last Year:   . Barista in the Last Year:   Transportation Needs:   . Freight forwarder (Medical):   Marland Kitchen Lack of Transportation (Non-Medical):   Physical Activity:   . Days of Exercise per Week:   . Minutes of Exercise per Session:   Stress:   . Feeling of Stress :   Social Connections:   . Frequency of  Communication with Friends and Family:   . Frequency of Social Gatherings with Friends and Family:   . Attends Religious Services:   . Active Member of Clubs or Organizations:   . Attends Banker Meetings:   Marland Kitchen Marital Status:   Intimate Partner Violence:   . Fear of Current or Ex-Partner:   . Emotionally Abused:   Marland Kitchen Physically Abused:   . Sexually Abused:     Family History  Problem Relation Age of Onset  . Diabetes Mother      Immunization History  Administered Date(s) Administered  . Influenza,inj,Quad PF,6+ Mos 03/06/2019    Outpatient Encounter Medications as of 08/09/2019  Medication Sig  . levonorgestrel (MIRENA) 20 MCG/24HR IUD 1 each by Intrauterine route once.  . metFORMIN (GLUCOPHAGE XR) 500 MG 24 hr tablet Take 1 tablet (500 mg total) by mouth daily with breakfast.   No facility-administered encounter medications on file as of 08/09/2019.     ROS: Pertinent positives and negatives noted in HPI. Remainder of ROS non-contributory Denies HA, CP, SOB, LE edema   No Known Allergies  BP 100/78 (BP Location: Right Arm, Patient Position: Sitting, Cuff Size: Normal)   Pulse 88   Temp 98.2 F (36.8 C) (Temporal)   Ht 5'  2" (1.575 m)   Wt 166 lb 6.4 oz (75.5 kg)   SpO2 97%   BMI 30.43 kg/m    Wt Readings from Last 3 Encounters:  08/09/19 166 lb 6.4 oz (75.5 kg)  03/06/19 161 lb (73 kg)  09/25/17 164 lb (74.4 kg)     Physical Exam  Constitutional: She is oriented to person, place, and time. She appears well-developed and well-nourished. No distress.  Cardiovascular: Normal rate, regular rhythm, normal heart sounds and intact distal pulses.  Pulmonary/Chest: Effort normal and breath sounds normal. No respiratory distress.  Musculoskeletal:        General: No edema.  Neurological: She is alert and oriented to person, place, and time.  Psychiatric: She has a normal mood and affect. Her behavior is normal.     A/P:  1. Controlled type 2  diabetes mellitus without complication, without long-term current use of insulin (HCC) - Hemoglobin A1c - cont metformin 500mg  daily - cont with regular CV exercise, healthy diet - f/u in 4-6 mo or sooner PRN

## 2019-08-14 ENCOUNTER — Other Ambulatory Visit: Payer: Self-pay | Admitting: Family Medicine

## 2019-08-14 ENCOUNTER — Encounter: Payer: Self-pay | Admitting: Family Medicine

## 2019-08-14 DIAGNOSIS — E119 Type 2 diabetes mellitus without complications: Secondary | ICD-10-CM

## 2019-08-14 MED ORDER — METFORMIN HCL ER 500 MG PO TB24: 1000.0000 mg | ORAL_TABLET | Freq: Every day | ORAL | 3 refills | Status: DC

## 2019-11-11 IMAGING — DX DG CHEST 2V
2 series · 2 of 2 positions shown · non-contrast
Comparison: None.

CLINICAL DATA: Shortness of breath.  Cold symptoms.

EXAM:
CHEST - 2 VIEW

[chest pa]
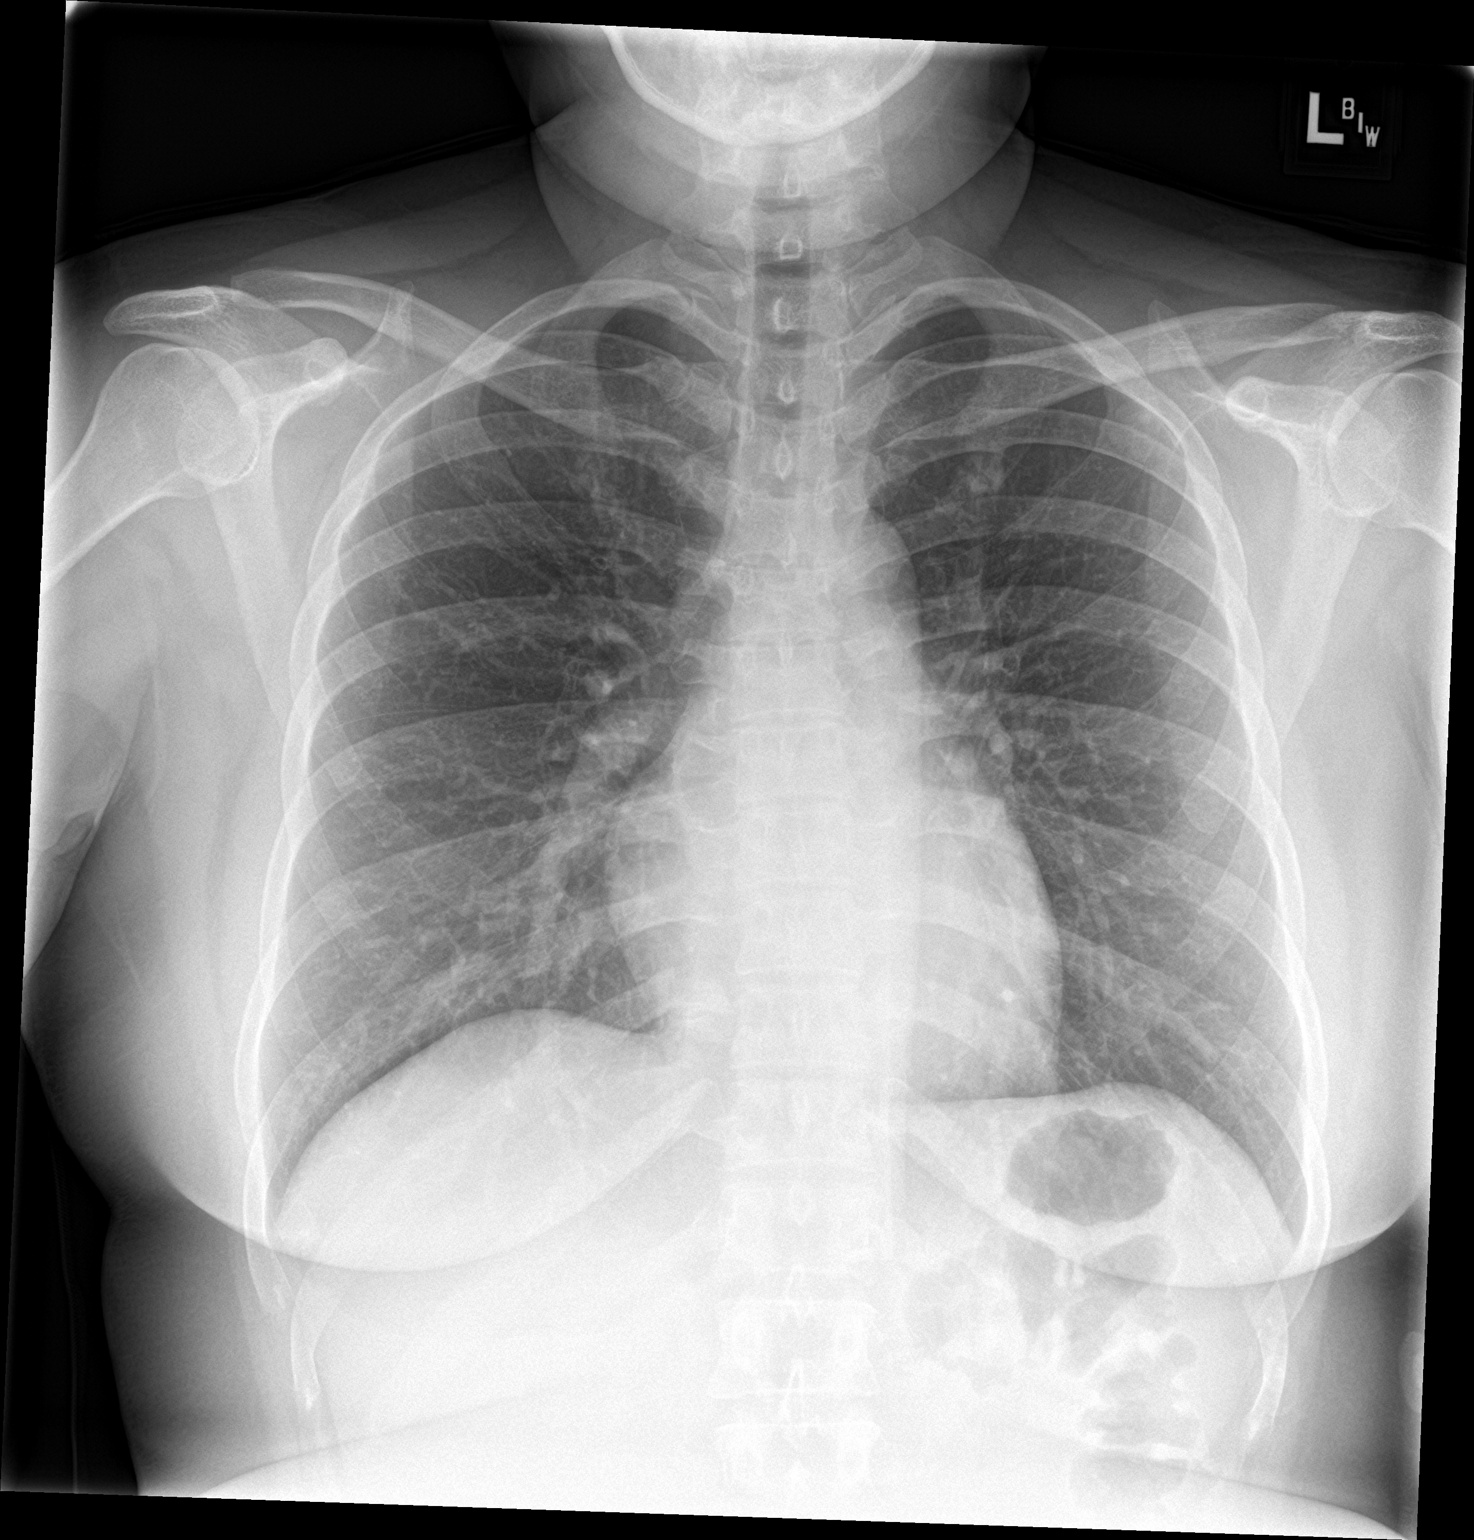

[chest lat]
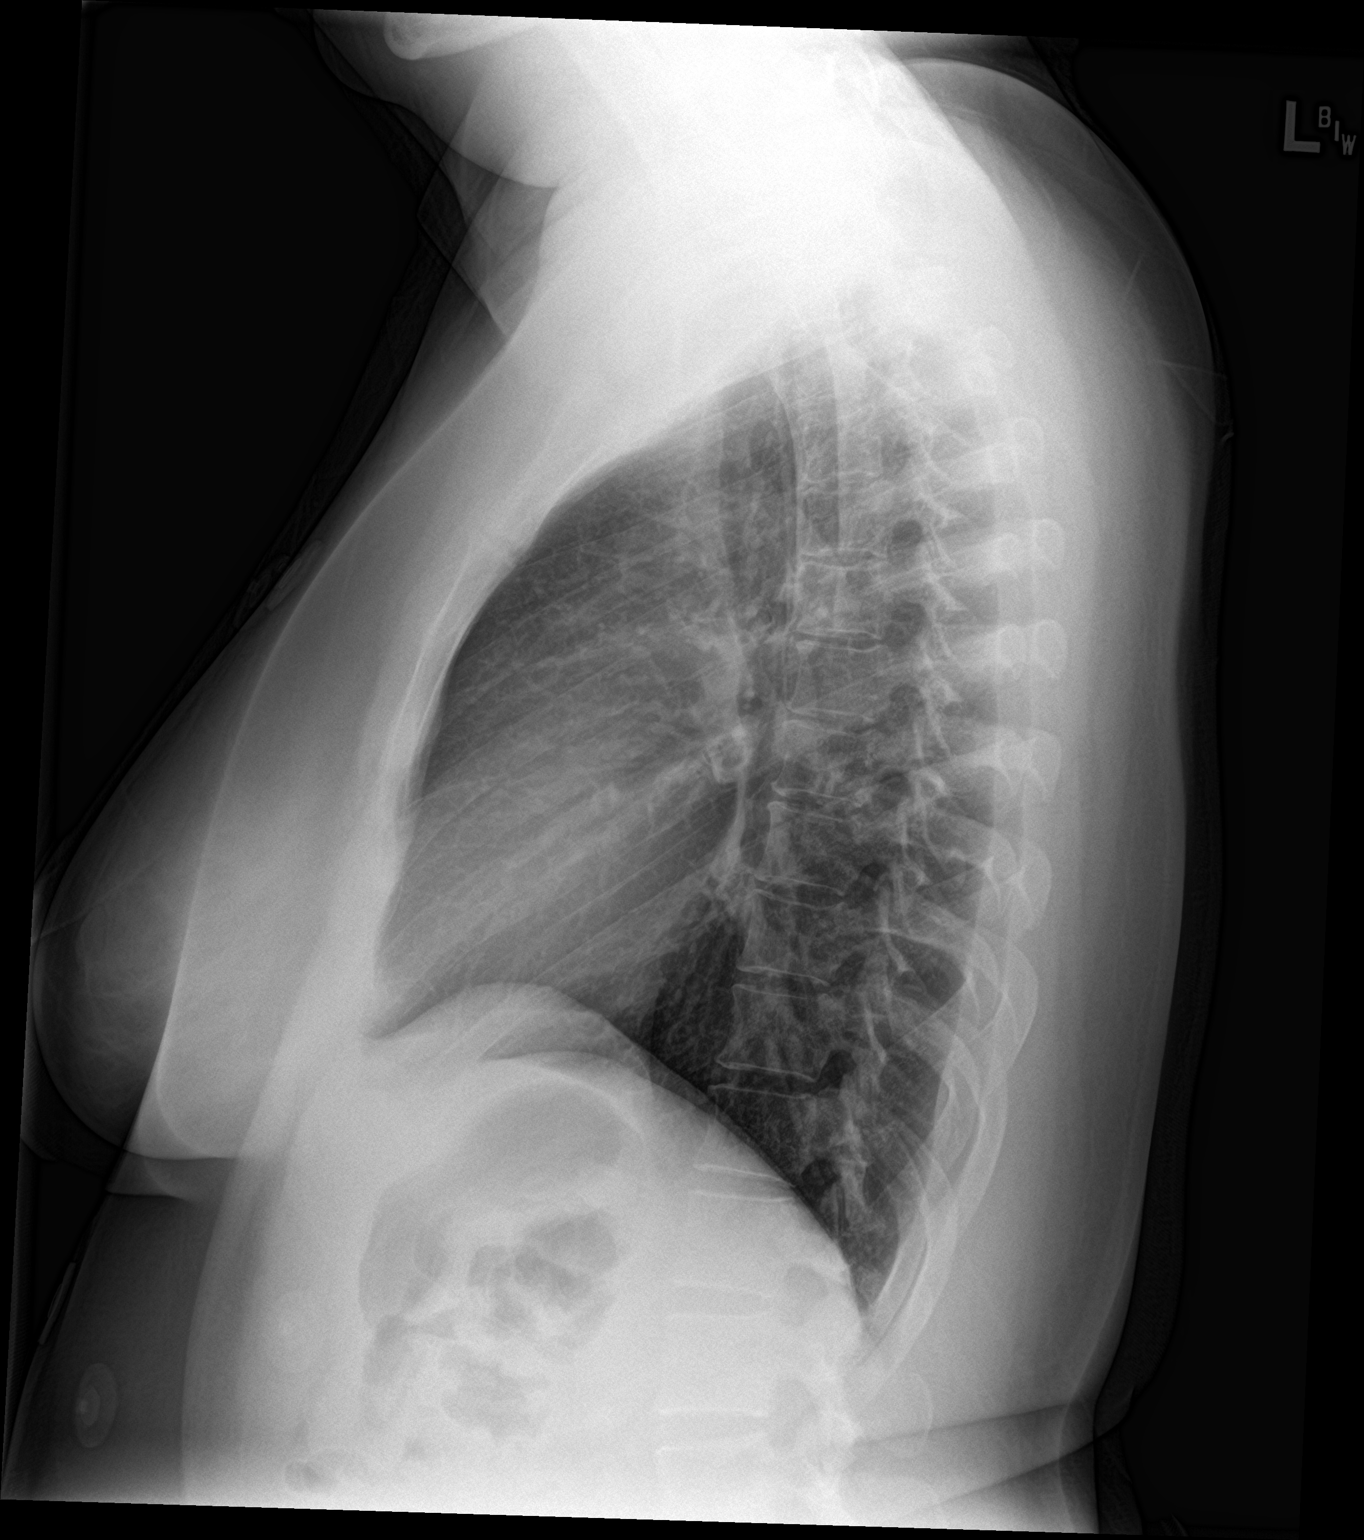

[2 of 2 positions shown; findings below may reference images not displayed]

FINDINGS: The heart size and mediastinal contours are within normal limits.
Both lungs are clear. The visualized skeletal structures are
unremarkable.
IMPRESSION: No active cardiopulmonary disease.

## 2019-12-05 ENCOUNTER — Telehealth: Payer: Self-pay | Admitting: Family Medicine

## 2019-12-05 NOTE — Telephone Encounter (Signed)
Patient called to schedule an appt with "bad cold, bad coughing" and wanting to test for strep. I let her know we are full today, called [place] location and they are full today. Patient decided to go to urgent care this morning and not wait for an appt tomorrow.

## 2019-12-07 ENCOUNTER — Encounter: Payer: Self-pay | Admitting: Family Medicine

## 2020-07-14 NOTE — Patient Instructions (Addendum)
Health Maintenance Due  Topic Date Due  . COVID-19 Vaccine (1) Never done  . URINE MICROALBUMIN  Never done  . TETANUS/TDAP  Never done  . INFLUENZA VACCINE  12/15/2019    Depression screen PHQ 2/9 03/06/2019  Decreased Interest 0  Down, Depressed, Hopeless 0  PHQ - 2 Score 0    [facility] [address] [place], [place] [address] [phone] [email or web address]  [facility] [address] [place], [place] [address] [phone] [email or web address]    [facility] [address], [place], [place] [address] [phone]  [facility] [address] [place], [place], [place] [address] [phone]

## 2020-07-17 ENCOUNTER — Ambulatory Visit (INDEPENDENT_AMBULATORY_CARE_PROVIDER_SITE_OTHER): Payer: BLUE CROSS/BLUE SHIELD | Admitting: Family Medicine

## 2020-07-17 ENCOUNTER — Encounter: Payer: Self-pay | Admitting: Family Medicine

## 2020-07-17 ENCOUNTER — Other Ambulatory Visit: Payer: Self-pay

## 2020-07-17 VITALS — BP 98/76 | HR 64 | Temp 97.5°F | Ht 62.0 in | Wt 167.0 lb

## 2020-07-17 DIAGNOSIS — Z2821 Immunization not carried out because of patient refusal: Secondary | ICD-10-CM | POA: Diagnosis not present

## 2020-07-17 DIAGNOSIS — Z0000 Encounter for general adult medical examination without abnormal findings: Secondary | ICD-10-CM

## 2020-07-17 DIAGNOSIS — Z23 Encounter for immunization: Secondary | ICD-10-CM | POA: Diagnosis not present

## 2020-07-17 DIAGNOSIS — E119 Type 2 diabetes mellitus without complications: Secondary | ICD-10-CM | POA: Diagnosis not present

## 2020-07-17 LAB — LIPID PANEL
Cholesterol: 199 mg/dL (ref 0–200)
HDL: 54.5 mg/dL (ref >39.00)
LDL Cholesterol: 123 mg/dL — ABNORMAL HIGH (ref 0–99)
NonHDL: 144.07
Total CHOL/HDL Ratio: 4
Triglycerides: 104 mg/dL (ref 0.0–149.0)
VLDL: 20.8 mg/dL (ref 0.0–40.0)

## 2020-07-17 LAB — MICROALBUMIN / CREATININE URINE RATIO
Creatinine,U: 105.1 mg/dL
Microalb Creat Ratio: 1.9 mg/g (ref 0.0–30.0)
Microalb, Ur: 2 mg/dL — ABNORMAL HIGH (ref 0.0–1.9)

## 2020-07-17 LAB — BASIC METABOLIC PANEL
BUN: 18 mg/dL (ref 6–23)
CO2: 26 mEq/L (ref 19–32)
Calcium: 9.4 mg/dL (ref 8.4–10.5)
Chloride: 102 mEq/L (ref 96–112)
Creatinine, Ser: 0.59 mg/dL (ref 0.40–1.20)
GFR: 115.95 mL/min (ref >60.00)
Glucose, Bld: 118 mg/dL — ABNORMAL HIGH (ref 70–99)
Potassium: 3.9 mEq/L (ref 3.5–5.1)
Sodium: 138 mEq/L (ref 135–145)

## 2020-07-17 LAB — CBC
HCT: 41.4 % (ref 36.0–46.0)
Hemoglobin: 13.4 g/dL (ref 12.0–15.0)
MCHC: 32.4 g/dL (ref 30.0–36.0)
MCV: 80.4 fl (ref 78.0–100.0)
Platelets: 209 10*3/uL (ref 150.0–400.0)
RBC: 5.15 Mil/uL — ABNORMAL HIGH (ref 3.87–5.11)
RDW: 14.3 % (ref 11.5–15.5)
WBC: 7.5 10*3/uL (ref 4.0–10.5)

## 2020-07-17 LAB — AST: AST: 15 U/L (ref 0–37)

## 2020-07-17 LAB — HEMOGLOBIN A1C: Hgb A1c MFr Bld: 7.4 % — ABNORMAL HIGH (ref 4.6–6.5)

## 2020-07-17 LAB — ALT: ALT: 17 U/L (ref 0–35)

## 2020-07-17 MED ORDER — METFORMIN HCL ER 500 MG PO TB24: 1000.0000 mg | ORAL_TABLET | Freq: Every day | ORAL | 3 refills | Status: DC

## 2020-07-17 NOTE — Progress Notes (Signed)
Paula Delgado is a 35 y.o. female  Chief Complaint  Patient presents with  . Annual Exam    CPE, pt fasting for labs.  Med refill, Pt has GYN, pt will receive Tdap today.  Pt not taking her Metformin consistently.    HPI: Paula Delgado is a 35 y.o. female seen today for annual CPE, fasting labs.  Last PAP: 09/2017 - normal PAP; Dr. Catalina Antigua. Pt has appt scheduled.  Diet/Exercise: cutting back on sweets and sodas; just got a treadmill at home Dental: due Vision: due, wears glasses  Med refills needed today? Yes per order  She has not been taking metformin consistently, estimates she takes 4x/wk.   History reviewed. No pertinent past medical history.  History reviewed. No pertinent surgical history.  Social History   Socioeconomic History  . Marital status: Single    Spouse name: Not on file  . Number of children: Not on file  . Years of education: Not on file  . Highest education level: Not on file  Occupational History  . Not on file  Tobacco Use  . Smoking status: Never Smoker  . Smokeless tobacco: Never Used  Substance and Sexual Activity  . Alcohol use: Yes  . Drug use: Never  . Sexual activity: Yes    Birth control/protection: I.U.D.  Other Topics Concern  . Not on file  Social History Narrative  . Not on file   Social Determinants of Health   Financial Resource Strain: Not on file  Food Insecurity: Not on file  Transportation Needs: Not on file  Physical Activity: Not on file  Stress: Not on file  Social Connections: Not on file  Intimate Partner Violence: Not on file    Family History  Problem Relation Age of Onset  . Diabetes Mother      Immunization History  Administered Date(s) Administered  . Influenza,inj,Quad PF,6+ Mos 03/06/2019    Outpatient Encounter Medications as of 07/17/2020  Medication Sig  . levonorgestrel (MIRENA) 20 MCG/24HR IUD 1 each by Intrauterine route once.  . [DISCONTINUED] metFORMIN (GLUCOPHAGE  XR) 500 MG 24 hr tablet Take 2 tablets (1,000 mg total) by mouth daily with breakfast.  . metFORMIN (GLUCOPHAGE XR) 500 MG 24 hr tablet Take 2 tablets (1,000 mg total) by mouth daily with breakfast.   No facility-administered encounter medications on file as of 07/17/2020.     ROS: Gen: no fever, chills  Skin: no rash, itching ENT: no ear pain, ear drainage, nasal congestion, rhinorrhea, sinus pressure, sore throat Eyes: no blurry vision, double vision Resp: no cough, wheeze,SOB Breast: no breast tenderness, no nipple discharge, no breast masses CV: no CP, palpitations, LE edema,  GI: no heartburn, n/v/d/c, abd pain GU: no dysuria, urgency, frequency, hematuria MSK: no joint pain, myalgias, back pain Neuro: no dizziness, headache, weakness, vertigo Psych: no depression, anxiety, insomnia   No Known Allergies  BP 98/76 (BP Location: Left Arm, Patient Position: Sitting, Cuff Size: Normal)   Pulse 64   Temp (!) 97.5 F (36.4 C) (Temporal)   Ht 5\' 2"  (1.575 m)   Wt 167 lb (75.8 kg)   SpO2 99%   BMI 30.54 kg/m   Wt Readings from Last 3 Encounters:  07/17/20 167 lb (75.8 kg)  08/09/19 166 lb 6.4 oz (75.5 kg)  03/06/19 161 lb (73 kg)   Temp Readings from Last 3 Encounters:  07/17/20 (!) 97.5 F (36.4 C) (Temporal)  08/09/19 98.2 F (36.8 C) (Temporal)  03/06/19 98.1 F (36.7  C) (Tympanic)   BP Readings from Last 3 Encounters:  07/17/20 98/76  08/09/19 100/78  03/06/19 128/80   Pulse Readings from Last 3 Encounters:  07/17/20 64  08/09/19 88  03/06/19 61     Physical Exam Constitutional:      General: She is not in acute distress.    Appearance: She is well-developed and well-nourished.  HENT:     Head: Normocephalic and atraumatic.     Right Ear: Tympanic membrane and ear canal normal.     Left Ear: Tympanic membrane and ear canal normal.     Nose: Nose normal.     Mouth/Throat:     Mouth: Oropharynx is clear and moist and mucous membranes are normal. Mucous  membranes are moist.     Pharynx: Oropharynx is clear.  Eyes:     Conjunctiva/sclera: Conjunctivae normal.  Neck:     Thyroid: No thyromegaly.  Cardiovascular:     Rate and Rhythm: Normal rate and regular rhythm.     Pulses: Intact distal pulses.     Heart sounds: Normal heart sounds. No murmur heard.   Pulmonary:     Effort: Pulmonary effort is normal. No respiratory distress.     Breath sounds: Normal breath sounds. No wheezing or rhonchi.  Abdominal:     General: Bowel sounds are normal. There is no distension.     Palpations: Abdomen is soft. There is no mass.     Tenderness: There is no abdominal tenderness.  Musculoskeletal:        General: No edema.     Cervical back: Neck supple.     Right lower leg: No edema.     Left lower leg: No edema.  Lymphadenopathy:     Cervical: No cervical adenopathy.  Skin:    General: Skin is warm and dry.  Neurological:     Mental Status: She is alert and oriented to person, place, and time.     Motor: No abnormal muscle tone.     Coordination: Coordination normal.  Psychiatric:        Mood and Affect: Mood and affect normal.        Behavior: Behavior normal.      A/P:  1. Annual physical exam - discussed importance of regular CV exercise, healthy diet, adequate sleep - due for PAP, follows with GYN - due for dental and vision exams - Tdap today, declines flu vaccine - CBC - Basic metabolic panel - AST - ALT - next CPE in 1 year  2. Controlled type 2 diabetes mellitus without complication, without long-term current use of insulin (HCC) - pt taking metformin about 4 days per week on average - Hemoglobin A1c - Microalbumin / creatinine urine ratio - Lipid panel Refill: - metFORMIN (GLUCOPHAGE XR) 500 MG 24 hr tablet; Take 2 tablets (1,000 mg total) by mouth daily with breakfast.  Dispense: 180 tablet; Refill: 3  3. Need for Tdap vaccination - Tdap vaccine greater than or equal to 7yo IM  4. Influenza vaccination  declined by patient    This visit occurred during the SARS-CoV-2 public health emergency.  Safety protocols were in place, including screening questions prior to the visit, additional usage of staff PPE, and extensive cleaning of exam room while observing appropriate contact time as indicated for disinfecting solutions.

## 2020-08-17 DIAGNOSIS — E119 Type 2 diabetes mellitus without complications: Secondary | ICD-10-CM

## 2020-08-17 DIAGNOSIS — E7800 Pure hypercholesterolemia, unspecified: Secondary | ICD-10-CM

## 2020-08-20 MED ORDER — ATORVASTATIN CALCIUM 10 MG PO TABS: 10.0000 mg | ORAL_TABLET | Freq: Every day | ORAL | 3 refills | Status: DC

## 2020-10-05 ENCOUNTER — Ambulatory Visit: Payer: BLUE CROSS/BLUE SHIELD | Admitting: Obstetrics and Gynecology

## 2020-10-14 ENCOUNTER — Ambulatory Visit: Payer: BLUE CROSS/BLUE SHIELD | Admitting: Family Medicine

## 2020-10-14 ENCOUNTER — Other Ambulatory Visit: Payer: Self-pay

## 2020-10-15 ENCOUNTER — Ambulatory Visit: Payer: BLUE CROSS/BLUE SHIELD | Admitting: Family Medicine

## 2020-10-15 ENCOUNTER — Encounter: Payer: Self-pay | Admitting: Family Medicine

## 2020-10-15 VITALS — BP 110/60 | HR 71 | Temp 97.7°F | Ht 62.0 in | Wt 167.0 lb

## 2020-10-15 DIAGNOSIS — E7800 Pure hypercholesterolemia, unspecified: Secondary | ICD-10-CM

## 2020-10-15 DIAGNOSIS — E119 Type 2 diabetes mellitus without complications: Secondary | ICD-10-CM | POA: Diagnosis not present

## 2020-10-15 LAB — POCT GLYCOSYLATED HEMOGLOBIN (HGB A1C): Hemoglobin A1C: 7.4 % — AB (ref 4.0–5.6)

## 2020-10-15 MED ORDER — METFORMIN HCL ER 750 MG PO TB24: 1500.0000 mg | ORAL_TABLET | Freq: Every day | ORAL | 2 refills | Status: DC

## 2020-10-15 NOTE — Progress Notes (Signed)
Chief Complaint  Patient presents with  . Follow-up    Pt here for sugar and cholesterol check.  Pt will have A1C checked today.    HPI: *Paula Delgado is a 35 y.o. female here for DM, hypercholesterolemia follow-up. For DM, pt is taking metformin 1000mg  daily. For hypercholesterolemia, pt is taking lipitor 10mg  daily. She denies medication side effects.   Pt does not check BS at home. Readings: n/a Hypoglycemia/Hypergylcemic episodes: no  Diet: has cut back soda, sweets Exercise: walking 3x/wk on treadmill  Lab Results  Component Value Date   HGBA1C 7.4 (A) 10/15/2020   Lab Results  Component Value Date   MICROALBUR 2.0 (H) 07/17/2020   Lab Results  Component Value Date   CREATININE 0.59 07/17/2020   Lab Results  Component Value Date   CHOL 199 07/17/2020   HDL 54.50 07/17/2020   LDLCALC 123 (H) 07/17/2020   TRIG 104.0 07/17/2020   CHOLHDL 4 07/17/2020    The ASCVD Risk score 09/16/2020 DC Jr., et al., 2013) failed to calculate for the following reasons:   The 2013 ASCVD risk score is only valid for ages 58 to 45   History reviewed. No pertinent past medical history.  History reviewed. No pertinent surgical history.  Social History   Socioeconomic History  . Marital status: Single    Spouse name: Not on file  . Number of children: Not on file  . Years of education: Not on file  . Highest education level: Not on file  Occupational History  . Not on file  Tobacco Use  . Smoking status: Never Smoker  . Smokeless tobacco: Never Used  Substance and Sexual Activity  . Alcohol use: Yes  . Drug use: Never  . Sexual activity: Yes    Birth control/protection: I.U.D.  Other Topics Concern  . Not on file  Social History Narrative  . Not on file   Social Determinants of Health   Financial Resource Strain: Not on file  Food Insecurity: Not on file  Transportation Needs: Not on file  Physical Activity: Not on file  Stress: Not on file  Social  Connections: Not on file  Intimate Partner Violence: Not on file    Family History  Problem Relation Age of Onset  . Diabetes Mother      Immunization History  Administered Date(s) Administered  . Influenza,inj,Quad PF,6+ Mos 03/06/2019  . PFIZER Comirnaty(Gray Top)Covid-19 Tri-Sucrose Vaccine 08/08/2019, 09/02/2019  . Tdap 07/17/2020    Outpatient Encounter Medications as of 10/15/2020  Medication Sig  . atorvastatin (LIPITOR) 10 MG tablet Take 1 tablet (10 mg total) by mouth daily.  09/16/2020 levonorgestrel (MIRENA) 20 MCG/24HR IUD 1 each by Intrauterine route once.  . [DISCONTINUED] metFORMIN (GLUCOPHAGE XR) 500 MG 24 hr tablet Take 2 tablets (1,000 mg total) by mouth daily with breakfast.  . metFORMIN (GLUCOPHAGE XR) 750 MG 24 hr tablet Take 2 tablets (1,500 mg total) by mouth daily with breakfast.   No facility-administered encounter medications on file as of 10/15/2020.     ROS: Pertinent positives and negatives noted in HPI. Remainder of ROS non-contributory    No Known Allergies  BP 110/60 (BP Location: Left Arm, Patient Position: Sitting, Cuff Size: Normal)   Pulse 71   Temp 97.7 F (36.5 C) (Temporal)   Ht 5\' 2"  (1.575 m)   Wt 167 lb (75.8 kg)   SpO2 99%   BMI 30.54 kg/m   Wt Readings from Last 3 Encounters:  10/15/20 167 lb (  75.8 kg)  07/17/20 167 lb (75.8 kg)  08/09/19 166 lb 6.4 oz (75.5 kg)   Temp Readings from Last 3 Encounters:  10/15/20 97.7 F (36.5 C) (Temporal)  07/17/20 (!) 97.5 F (36.4 C) (Temporal)  08/09/19 98.2 F (36.8 C) (Temporal)   BP Readings from Last 3 Encounters:  10/15/20 110/60  07/17/20 98/76  08/09/19 100/78   Pulse Readings from Last 3 Encounters:  10/15/20 71  07/17/20 64  08/09/19 88     Physical Exam Constitutional:      General: She is not in acute distress.    Appearance: Normal appearance. She is not ill-appearing.  Cardiovascular:     Rate and Rhythm: Normal rate and regular rhythm.     Pulses: Normal pulses.   Pulmonary:     Effort: Pulmonary effort is normal. No respiratory distress.     Breath sounds: Normal breath sounds.  Musculoskeletal:     Right lower leg: No edema.     Left lower leg: No edema.  Neurological:     Mental Status: She is alert and oriented to person, place, and time.  Psychiatric:        Mood and Affect: Mood normal.        Behavior: Behavior normal.      A/P:  1. Hypercholesterolemia - on lipitor 10mg  daily x 2 mo, continue - will recheck FLP at next OV in 3 mo - Lipid panel; Future  2. Controlled type 2 diabetes mellitus without complication, without long-term current use of insulin (HCC) - POCT HgB A1C = 7.4 (was 7.5 in 07/2020) Increase: - metFORMIN (GLUCOPHAGE XR) 750 MG 24 hr tablet; Take 2 tablets (1,500 mg total) by mouth daily with breakfast.  Dispense: 180 tablet; Refill: 2 - increased from 1000mg  daily - cont to improve diet - fewer carbs, less sugar - and continue walking at least 3 days per week - Hemoglobin A1c; Future - f/u in 3 mo   This visit occurred during the SARS-CoV-2 public health emergency.  Safety protocols were in place, including screening questions prior to the visit, additional usage of staff PPE, and extensive cleaning of exam room while observing appropriate contact time as indicated for disinfecting solutions.

## 2020-10-27 ENCOUNTER — Ambulatory Visit: Payer: BLUE CROSS/BLUE SHIELD | Admitting: Obstetrics and Gynecology

## 2021-07-05 NOTE — Progress Notes (Addendum)
Established Patient Office Visit  Subjective:  Patient ID: Paula Delgado, female    DOB: Feb 11, 1986  Age: 36 y.o. MRN: [id number]  CC:  Chief Complaint  Patient presents with   Annual Exam    TOC. CPE. Med review/ refills    HPI Paula Delgado presents to transfer care to a new provider.  Introduced to Designer, jewellery role and practice setting.  All questions answered.  Discussed provider/patient relationship and expectations. She is here for an annual exam and to follow-up on diabetes and hyperlipidemia.   DIABETES  She has not been taking metformin consistently Hypoglycemic episodes:no Polydipsia/polyuria: no Visual disturbance: no Chest pain: no Paresthesias: no Glucose Monitoring: no  Accucheck frequency: Not Checking  Fasting glucose:  Post prandial:  Evening:  Before meals: Taking Insulin?: no  Long acting insulin:  Short acting insulin: Blood Pressure Monitoring: not checking Retinal Examination: Up to Date Foot Exam: Not up to Date Diabetic Education: Completed Pneumovax: Not up to Date Influenza: Not up to Date Aspirin: no  She also has a history of anxiety and stress. She has been worrying frequently. She also states that she has some irritability and mood swings. She denies SI/HI. She does not like to take medications but would be interested in therapy.   Paula Delgado would like her IUD removed and start on the birth control pill. She and her husband are discussing trying for another baby in the near future. Her IUD is due to be removed in March 2023  Depression screen [facility] 2/9 07/06/2021 07/06/2021 03/06/2019  Decreased Interest 1 0 0  Down, Depressed, Hopeless 1 0 0  PHQ - 2 Score 2 0 0  Altered sleeping 2 - -  Tired, decreased energy 2 - -  Change in appetite 2 - -  Feeling bad or failure about yourself  2 - -  Trouble concentrating 1 - -  Moving slowly or fidgety/restless 0 - -  Suicidal thoughts 0 - -  PHQ-9 Score 11 - -  Difficult  doing work/chores Somewhat difficult - -   GAD 7 : Generalized Anxiety Score 07/06/2021  Nervous, Anxious, on Edge 1  Control/stop worrying 2  Worry too much - different things 1  Trouble relaxing 2  Restless 1  Easily annoyed or irritable 2  Afraid - awful might happen 0  Total GAD 7 Score 9  Anxiety Difficulty Somewhat difficult    Past Medical History:  Diagnosis Date   Anxiety    Diabetes mellitus without complication (HCC)    Hyperlipidemia     History reviewed. No pertinent surgical history.  Family History  Problem Relation Age of Onset   Diabetes Mother     Social History   Socioeconomic History   Marital status: Single    Spouse name: Not on file   Number of children: Not on file   Years of education: Not on file   Highest education level: Not on file  Occupational History   Not on file  Tobacco Use   Smoking status: Never   Smokeless tobacco: Never  Vaping Use   Vaping Use: Never used  Substance and Sexual Activity   Alcohol use: Yes   Drug use: Never   Sexual activity: Yes    Birth control/protection: I.U.D.  Other Topics Concern   Not on file  Social History Narrative   Not on file   Social Determinants of Health   Financial Resource Strain: Not on file  Food Insecurity: Not on file  Transportation  Needs: Not on file  Physical Activity: Not on file  Stress: Not on file  Social Connections: Not on file  Intimate Partner Violence: Not on file    Outpatient Medications Prior to Visit  Medication Sig Dispense Refill   Multiple Vitamin (MULTIVITAMIN) tablet Take 1 tablet by mouth daily.     atorvastatin (LIPITOR) 10 MG tablet Take 1 tablet (10 mg total) by mouth daily. 90 tablet 3   levonorgestrel (MIRENA) 20 MCG/24HR IUD 1 each by Intrauterine route once.     metFORMIN (GLUCOPHAGE XR) 750 MG 24 hr tablet Take 2 tablets (1,500 mg total) by mouth daily with breakfast. 180 tablet 2   No facility-administered medications prior to visit.     No Known Allergies  ROS Review of Systems  Constitutional: Negative.   HENT: Negative.    Eyes: Negative.   Respiratory: Negative.    Cardiovascular: Negative.   Gastrointestinal: Negative.   Endocrine: Negative.   Genitourinary: Negative.   Musculoskeletal: Negative.   Skin: Negative.   Neurological: Negative.   Psychiatric/Behavioral:  The patient is nervous/anxious.      Objective:    Physical Exam Vitals and nursing note reviewed. Exam conducted with a chaperone present.  Constitutional:      General: She is not in acute distress.    Appearance: Normal appearance.  HENT:     Head: Normocephalic and atraumatic.     Right Ear: Tympanic membrane, ear canal and external ear normal.     Left Ear: Tympanic membrane, ear canal and external ear normal.     Nose: Nose normal.     Mouth/Throat:     Mouth: Mucous membranes are moist.     Pharynx: Oropharynx is clear.  Eyes:     Conjunctiva/sclera: Conjunctivae normal.     Pupils: Pupils are equal, round, and reactive to light.  Cardiovascular:     Rate and Rhythm: Normal rate and regular rhythm.     Pulses: Normal pulses.     Heart sounds: Normal heart sounds.  Pulmonary:     Effort: Pulmonary effort is normal.     Breath sounds: Normal breath sounds.  Chest:  Breasts:    Right: Normal.     Left: Normal.  Abdominal:     General: Bowel sounds are normal.     Palpations: Abdomen is soft.     Tenderness: There is no abdominal tenderness.  Genitourinary:    Exam position: Lithotomy position.     Labia:        Right: No rash, tenderness, lesion or injury.        Left: No rash, tenderness, lesion or injury.      Vagina: Normal.     Cervix: Normal.     Uterus: Normal.      Adnexa: Right adnexa normal and left adnexa normal.  Musculoskeletal:        General: Normal range of motion.     Cervical back: Normal range of motion and neck supple. No tenderness.  Lymphadenopathy:     Cervical: No cervical adenopathy.      Upper Body:     Right upper body: No supraclavicular or axillary adenopathy.     Left upper body: No supraclavicular or axillary adenopathy.  Skin:    General: Skin is warm and dry.  Neurological:     General: No focal deficit present.     Mental Status: She is alert and oriented to person, place, and time.  Psychiatric:  Mood and Affect: Mood normal.        Behavior: Behavior normal.        Thought Content: Thought content normal.        Judgment: Judgment normal.    BP 102/74    Temp (!) 97 F (36.1 C) (Temporal)    Wt 165 lb 6.4 oz (75 kg)    BMI 30.25 kg/m  Wt Readings from Last 3 Encounters:  07/06/21 165 lb 6.4 oz (75 kg)  10/15/20 167 lb (75.8 kg)  07/17/20 167 lb (75.8 kg)     Health Maintenance Due  Topic Date Due   FOOT EXAM  Never done   OPHTHALMOLOGY EXAM  Never done   COVID-19 Vaccine (3 - Booster for Pfizer series) 10/28/2019   PAP SMEAR-Modifier  09/25/2020    There are no preventive care reminders to display for this patient.  Lab Results  Component Value Date   TSH 1.290 09/25/2017   Lab Results  Component Value Date   WBC 6.6 07/06/2021   HGB 14.2 07/06/2021   HCT 44.7 07/06/2021   MCV 81.6 07/06/2021   PLT 235.0 07/06/2021   Lab Results  Component Value Date   NA 137 07/06/2021   K 4.1 07/06/2021   CO2 29 07/06/2021   GLUCOSE 118 (H) 07/06/2021   BUN 14 07/06/2021   CREATININE 0.75 07/06/2021   BILITOT 0.6 07/06/2021   ALKPHOS 42 07/06/2021   AST 20 07/06/2021   ALT 18 07/06/2021   PROT 8.5 (H) 07/06/2021   ALBUMIN 4.8 07/06/2021   CALCIUM 9.8 07/06/2021   ANIONGAP 10 08/08/2017   GFR 101.73 07/06/2021   Lab Results  Component Value Date   CHOL 214 (H) 07/06/2021   Lab Results  Component Value Date   HDL 62.90 07/06/2021   Lab Results  Component Value Date   LDLCALC 128 (H) 07/06/2021   Lab Results  Component Value Date   TRIG 112.0 07/06/2021   Lab Results  Component Value Date   CHOLHDL 3 07/06/2021    Lab Results  Component Value Date   HGBA1C 7.8 (A) 07/06/2021   HGBA1C 7.8 07/06/2021   HGBA1C 7.8 (A) 07/06/2021   HGBA1C 7.8 (A) 07/06/2021      Assessment & Plan:   Problem List Items Addressed This Visit       Endocrine   Diabetes mellitus without complication (HCC)    [id number] is 7.8% today, which is above goal. Encouraged her to take metformin consistently. Refill sent to the pharmacy. She is taking atorvastatin daily, discussed stopping this when she starts trying for pregnancy. Discussed eye exam today, she states she went within the past year. Will request records from Dr. Eulas Post with [facility]. Prevnar 20 updated today. Discussed diet and exercise. Check CMP, CBC, and urine microalbumin today. She is not on an ACE/ARB but will hold on starting do to desiring pregnancy in the near future. Follow up in 6 months.       Relevant Medications   atorvastatin (LIPITOR) 10 MG tablet   metFORMIN (GLUCOPHAGE XR) 750 MG 24 hr tablet   Other Relevant Orders   POCT HgB A1C (Completed)   CBC with Differential/Platelet (Completed)   Comprehensive metabolic panel (Completed)   Microalbumin / creatinine urine ratio (Completed)   Hyperlipidemia associated with type 2 diabetes mellitus (HCC)    Chronic, ongoing. She is taking atorvastatin 10mg  daily. Refill sent to pharmacy. Discussed that as soon as she decides to stop oral birth control  pill and trying for pregnancy, she needs to stop this medication. Check lipid panel today.       Relevant Medications   atorvastatin (LIPITOR) 10 MG tablet   metFORMIN (GLUCOPHAGE XR) 750 MG 24 hr tablet   Other Relevant Orders   Lipid panel (Completed)     Other   Birth control counseling    She would like to discontinue her IUD as she is interested in trying for pregnancy in the near future. Discussed various birth control options and she would like to start the pill. IUD removed today with no complications. Start loestrin FE daily. Discussed  starting it today and to make sure she takes it at the same time every day. Follow up with any concerns.       Anxiety    PHQ 9 is 11 and GAD 7 is 9. She is not interested in starting medication. Discussed non-pharmcological treatment including exercise, meditating, listening to music, and journaling. Will place referral to psychiatry per patient request. Follow up with any concerns or worsening symptoms.       Relevant Orders   Ambulatory referral to Psychiatry   Obesity (BMI 30-39.9)    BMI 30.2. Discussed diet and exercise. Goal is to lose 1-2 pounds per week.       Relevant Medications   metFORMIN (GLUCOPHAGE XR) 750 MG 24 hr tablet   Other Visit Diagnoses     Routine general medical examination at a health care facility    -  Primary   Health maintenance reviewed and updated. Check CMP, CBC. Prevnanr 20 and pap updated today. F/U 1 year   Screening for cervical cancer       Pap done today   Relevant Orders   Cytology - PAP   Need for pneumococcal vaccine       Prevnar 20 given today due to history of diabetes   Relevant Orders   Pneumococcal conjugate vaccine 20-valent (Prevnar 20) (Completed)   Encounter for IUD removal       Discussed procedure, patient gave verbal consent for removal. IUD string identified and IUD removed intact with no complications.        Meds ordered this encounter  Medications   norethindrone-ethinyl estradiol-FE (LOESTRIN FE 1/20) 1-20 MG-MCG tablet    Sig: Take 1 tablet by mouth daily.    Dispense:  28 tablet    Refill:  11   atorvastatin (LIPITOR) 10 MG tablet    Sig: Take 1 tablet (10 mg total) by mouth daily.    Dispense:  90 tablet    Refill:  1   metFORMIN (GLUCOPHAGE XR) 750 MG 24 hr tablet    Sig: Take 2 tablets (1,500 mg total) by mouth daily with breakfast.    Dispense:  180 tablet    Refill:  1    Follow-up: Return in about 6 months (around 01/03/2022) for Diabetes.    Charyl Dancer, NP

## 2021-07-06 ENCOUNTER — Encounter: Payer: Self-pay | Admitting: Nurse Practitioner

## 2021-07-06 ENCOUNTER — Ambulatory Visit (INDEPENDENT_AMBULATORY_CARE_PROVIDER_SITE_OTHER): Payer: No Typology Code available for payment source | Admitting: Nurse Practitioner

## 2021-07-06 ENCOUNTER — Other Ambulatory Visit (HOSPITAL_COMMUNITY)
Admission: RE | Admit: 2021-07-06 | Discharge: 2021-07-06 | Disposition: A | Discharge status: Home / Self Care | Payer: No Typology Code available for payment source | Source: Ambulatory Visit | Attending: Nurse Practitioner | Admitting: Nurse Practitioner

## 2021-07-06 ENCOUNTER — Other Ambulatory Visit: Payer: Self-pay

## 2021-07-06 VITALS — BP 102/74 | Temp 97.0°F | Wt 165.4 lb

## 2021-07-06 DIAGNOSIS — Z23 Encounter for immunization: Secondary | ICD-10-CM | POA: Diagnosis not present

## 2021-07-06 DIAGNOSIS — Z30432 Encounter for removal of intrauterine contraceptive device: Secondary | ICD-10-CM

## 2021-07-06 DIAGNOSIS — E669 Obesity, unspecified: Secondary | ICD-10-CM

## 2021-07-06 DIAGNOSIS — E785 Hyperlipidemia, unspecified: Secondary | ICD-10-CM | POA: Diagnosis not present

## 2021-07-06 DIAGNOSIS — Z124 Encounter for screening for malignant neoplasm of cervix: Secondary | ICD-10-CM

## 2021-07-06 DIAGNOSIS — Z0000 Encounter for general adult medical examination without abnormal findings: Secondary | ICD-10-CM

## 2021-07-06 DIAGNOSIS — Z3009 Encounter for other general counseling and advice on contraception: Secondary | ICD-10-CM

## 2021-07-06 DIAGNOSIS — F419 Anxiety disorder, unspecified: Secondary | ICD-10-CM

## 2021-07-06 DIAGNOSIS — E1169 Type 2 diabetes mellitus with other specified complication: Secondary | ICD-10-CM | POA: Diagnosis not present

## 2021-07-06 DIAGNOSIS — E119 Type 2 diabetes mellitus without complications: Secondary | ICD-10-CM

## 2021-07-06 LAB — MICROALBUMIN / CREATININE URINE RATIO
Creatinine,U: 154.7 mg/dL
Microalb Creat Ratio: 0.6 mg/g (ref 0.0–30.0)
Microalb, Ur: 1 mg/dL (ref 0.0–1.9)

## 2021-07-06 LAB — CBC WITH DIFFERENTIAL/PLATELET
Basophils Absolute: 0 10*3/uL (ref 0.0–0.1)
Basophils Relative: 0.6 % (ref 0.0–3.0)
Eosinophils Absolute: 0.2 10*3/uL (ref 0.0–0.7)
Eosinophils Relative: 3 % (ref 0.0–5.0)
HCT: 44.7 % (ref 36.0–46.0)
Hemoglobin: 14.2 g/dL (ref 12.0–15.0)
Lymphocytes Relative: 30.4 % (ref 12.0–46.0)
Lymphs Abs: 2 10*3/uL (ref 0.7–4.0)
MCHC: 31.9 g/dL (ref 30.0–36.0)
MCV: 81.6 fl (ref 78.0–100.0)
Monocytes Absolute: 0.5 10*3/uL (ref 0.1–1.0)
Monocytes Relative: 7.5 % (ref 3.0–12.0)
Neutro Abs: 3.8 10*3/uL (ref 1.4–7.7)
Neutrophils Relative %: 58.5 % (ref 43.0–77.0)
Platelets: 235 10*3/uL (ref 150.0–400.0)
RBC: 5.48 Mil/uL — ABNORMAL HIGH (ref 3.87–5.11)
RDW: 13 % (ref 11.5–15.5)
WBC: 6.6 10*3/uL (ref 4.0–10.5)

## 2021-07-06 LAB — COMPREHENSIVE METABOLIC PANEL
ALT: 18 U/L (ref 0–35)
AST: 20 U/L (ref 0–37)
Albumin: 4.8 g/dL (ref 3.5–5.2)
Alkaline Phosphatase: 42 U/L (ref 39–117)
BUN: 14 mg/dL (ref 6–23)
CO2: 29 mEq/L (ref 19–32)
Calcium: 9.8 mg/dL (ref 8.4–10.5)
Chloride: 101 mEq/L (ref 96–112)
Creatinine, Ser: 0.75 mg/dL (ref 0.40–1.20)
GFR: 101.73 mL/min (ref >60.00)
Glucose, Bld: 118 mg/dL — ABNORMAL HIGH (ref 70–99)
Potassium: 4.1 mEq/L (ref 3.5–5.1)
Sodium: 137 mEq/L (ref 135–145)
Total Bilirubin: 0.6 mg/dL (ref 0.2–1.2)
Total Protein: 8.5 g/dL — ABNORMAL HIGH (ref 6.0–8.3)

## 2021-07-06 LAB — POCT GLYCOSYLATED HEMOGLOBIN (HGB A1C)
HbA1c POC (<> result, manual entry): 7.8 % (ref 4.0–5.6)
HbA1c, POC (controlled diabetic range): 7.8 % — AB (ref 0.0–7.0)
HbA1c, POC (prediabetic range): 7.8 % — AB (ref 5.7–6.4)
Hemoglobin A1C: 7.8 % — AB (ref 4.0–5.6)

## 2021-07-06 LAB — LIPID PANEL
Cholesterol: 214 mg/dL — ABNORMAL HIGH (ref 0–200)
HDL: 62.9 mg/dL (ref >39.00)
LDL Cholesterol: 128 mg/dL — ABNORMAL HIGH (ref 0–99)
NonHDL: 150.71
Total CHOL/HDL Ratio: 3
Triglycerides: 112 mg/dL (ref 0.0–149.0)
VLDL: 22.4 mg/dL (ref 0.0–40.0)

## 2021-07-06 MED ORDER — ATORVASTATIN CALCIUM 10 MG PO TABS: 10.0000 mg | ORAL_TABLET | Freq: Every day | ORAL | 1 refills | Status: DC

## 2021-07-06 MED ORDER — METFORMIN HCL ER 750 MG PO TB24: 1500.0000 mg | ORAL_TABLET | Freq: Every day | ORAL | 1 refills | Status: DC

## 2021-07-06 MED ORDER — NORETHIN ACE-ETH ESTRAD-FE 1-20 MG-MCG PO TABS: 1.0000 | ORAL_TABLET | Freq: Every day | ORAL | 11 refills | Status: DC

## 2021-07-06 NOTE — Assessment & Plan Note (Signed)
She would like to discontinue her IUD as she is interested in trying for pregnancy in the near future. Discussed various birth control options and she would like to start the pill. IUD removed today with no complications. Start loestrin FE daily. Discussed starting it today and to make sure she takes it at the same time every day. Follow up with any concerns.

## 2021-07-06 NOTE — Assessment & Plan Note (Signed)
Chronic, ongoing. She is taking atorvastatin 10mg  daily. Refill sent to pharmacy. Discussed that as soon as she decides to stop oral birth control pill and trying for pregnancy, she needs to stop this medication. Check lipid panel today.

## 2021-07-06 NOTE — Assessment & Plan Note (Addendum)
A1C is 7.8% today, which is above goal. Encouraged her to take metformin consistently. Refill sent to the pharmacy. She is taking atorvastatin daily, discussed stopping this when she starts trying for pregnancy. Discussed eye exam today, she states she went within the past year. Will request records from Dr. Sherrine Maples with [facility]. Prevnar 20 updated today. Discussed diet and exercise. Check CMP, CBC, and urine microalbumin today. She is not on an ACE/ARB but will hold on starting do to desiring pregnancy in the near future. Follow up in 6 months.

## 2021-07-06 NOTE — Patient Instructions (Addendum)
It was great to see you!  We are checking some labs today and will send them to mychart. Start birth control tablets daily at the same time every day.   Your A1C is not at goal at 7.8%, make sure you are taking your metformin daily.  Let's follow-up in 6 months, sooner if you have concerns.  If a referral was placed today, you will be contacted for an appointment. Please note that routine referrals can sometimes take up to 3-4 weeks to process. Please call our office if you haven't heard anything after this time frame.  Take care,  Rodman Pickle, NP

## 2021-07-06 NOTE — Assessment & Plan Note (Signed)
BMI 30.2. Discussed diet and exercise. Goal is to lose 1-2 pounds per week.

## 2021-07-06 NOTE — Assessment & Plan Note (Signed)
PHQ 9 is 11 and GAD 7 is 9. She is not interested in starting medication. Discussed non-pharmcological treatment including exercise, meditating, listening to music, and journaling. Will place referral to psychiatry per patient request. Follow up with any concerns or worsening symptoms.

## 2021-07-08 LAB — CYTOLOGY - PAP
Comment: NEGATIVE
Diagnosis: NEGATIVE
High risk HPV: NEGATIVE

## 2021-09-27 ENCOUNTER — Encounter: Payer: Self-pay | Admitting: Radiology

## 2021-09-27 ENCOUNTER — Ambulatory Visit (INDEPENDENT_AMBULATORY_CARE_PROVIDER_SITE_OTHER): Payer: No Typology Code available for payment source

## 2021-09-27 DIAGNOSIS — Z3202 Encounter for pregnancy test, result negative: Secondary | ICD-10-CM

## 2021-09-27 DIAGNOSIS — Z3200 Encounter for pregnancy test, result unknown: Secondary | ICD-10-CM

## 2021-09-27 DIAGNOSIS — N912 Amenorrhea, unspecified: Secondary | ICD-10-CM

## 2021-09-27 LAB — POCT URINE PREGNANCY: Preg Test, Ur: NEGATIVE

## 2021-09-27 NOTE — Progress Notes (Signed)
Patient was assessed and managed by nursing staff during this encounter. I have reviewed the chart and agree with the documentation and plan. I have also made any necessary editorial changes. ? ?Paula Antigua, MD ?09/27/2021 7:23 PM  ? ?

## 2021-09-27 NOTE — Progress Notes (Signed)
..  Ms. Spearing presents today for UPT. She is actively trying to conceive and states that home UPT was negative but the same thing happen with her previous child. The patient has an appointment on 11/09/21 for annual. ?LMP: 08/26/21 ?   ?OBJECTIVE: Appears well, in no apparent distress.  ?OB History   ? ? Gravida  ?2  ? Para  ?1  ? Term  ?   ? Preterm  ?1  ? AB  ?1  ? Living  ?1  ?  ? ? SAB  ?   ? IAB  ?1  ? Ectopic  ?   ? Multiple  ?   ? Live Births  ?1  ?   ?  ?  ? ?Home UPT Result: Negative ?In-Office UPT result: Negative ?I have reviewed the patient's medical, obstetrical, social, and family histories, and medications.  ? ?ASSESSMENT: Negative pregnancy test ? ?PLAN ?Pt will monitor next cycle and follow up accordingly. ? ? ?

## 2021-11-09 ENCOUNTER — Ambulatory Visit: Payer: No Typology Code available for payment source | Admitting: Obstetrics and Gynecology

## 2021-12-21 ENCOUNTER — Ambulatory Visit: Payer: No Typology Code available for payment source | Admitting: Nurse Practitioner

## 2021-12-27 ENCOUNTER — Ambulatory Visit: Payer: No Typology Code available for payment source | Admitting: Nurse Practitioner

## 2021-12-31 ENCOUNTER — Encounter: Payer: Self-pay | Admitting: Nurse Practitioner

## 2021-12-31 ENCOUNTER — Ambulatory Visit (INDEPENDENT_AMBULATORY_CARE_PROVIDER_SITE_OTHER): Payer: No Typology Code available for payment source | Admitting: Nurse Practitioner

## 2021-12-31 VITALS — BP 124/78 | HR 54 | Temp 97.7°F | Wt 161.6 lb

## 2021-12-31 DIAGNOSIS — E119 Type 2 diabetes mellitus without complications: Secondary | ICD-10-CM

## 2021-12-31 DIAGNOSIS — E1169 Type 2 diabetes mellitus with other specified complication: Secondary | ICD-10-CM

## 2021-12-31 DIAGNOSIS — R051 Acute cough: Secondary | ICD-10-CM | POA: Diagnosis not present

## 2021-12-31 DIAGNOSIS — N926 Irregular menstruation, unspecified: Secondary | ICD-10-CM | POA: Diagnosis not present

## 2021-12-31 DIAGNOSIS — E785 Hyperlipidemia, unspecified: Secondary | ICD-10-CM

## 2021-12-31 LAB — POCT GLYCOSYLATED HEMOGLOBIN (HGB A1C)
HbA1c POC (<> result, manual entry): 7.1 % (ref 4.0–5.6)
HbA1c, POC (controlled diabetic range): 7.1 % — AB (ref 0.0–7.0)
Hemoglobin A1C: 7.1 % — AB (ref 4.0–5.6)

## 2021-12-31 LAB — POCT URINE PREGNANCY: Preg Test, Ur: NEGATIVE

## 2021-12-31 MED ORDER — PRENATAL 27-0.8 MG PO TABS
1.0000 | ORAL_TABLET | Freq: Every day | ORAL | 3 refills | Status: DC
Start: 2021-12-31 — End: 2023-03-13

## 2021-12-31 MED ORDER — ONETOUCH ULTRASOFT LANCETS MISC: 12 refills | Status: AC

## 2021-12-31 MED ORDER — METFORMIN HCL ER 750 MG PO TB24: 1500.0000 mg | ORAL_TABLET | Freq: Every day | ORAL | 1 refills | Status: DC

## 2021-12-31 MED ORDER — ONETOUCH ULTRA 2 W/DEVICE KIT
1.0000 | PACK | Freq: Every day | 0 refills | Status: DC
Start: 2021-12-31 — End: 2023-09-11

## 2021-12-31 MED ORDER — LORATADINE 10 MG PO TABS: 10.0000 mg | ORAL_TABLET | Freq: Every day | ORAL | 11 refills | Status: DC

## 2021-12-31 MED ORDER — ONETOUCH ULTRA VI STRP: ORAL_STRIP | 12 refills | Status: AC

## 2021-12-31 NOTE — Assessment & Plan Note (Signed)
A1c today is controlled at 7.1%.  Continue metformin XR 1500 mg a day.  Encouraged her to schedule an eye exam to get this yearly.  She stopped her Lipitor because she is actively trying to conceive.  Continue current regimen.  Follow-up in 6 months.

## 2021-12-31 NOTE — Assessment & Plan Note (Signed)
Chronic, stable.  Continue holding atorvastatin until after pregnancy.

## 2021-12-31 NOTE — Progress Notes (Signed)
Established Patient Office Visit  Subjective   Patient ID: Paula Delgado, female    DOB: 16-Apr-1986  Age: 36 y.o. MRN: [id number]  Chief Complaint  Patient presents with   Follow-up    6 mo f/u HTN, DM. Pt requesting urine and blood pregnancy test.     HPI  Paula Delgado is here to follow-up on diabetes. She is currently taking metformin 1,500 mg daily. She does not check her blood sugars at home.  She stopped her Lipitor because she is actively trying to get pregnant.  She denies chest pain, shortness of breath, urinary frequency, blurry vision.  She has been having a dry cough for 3 weeks. Home covid-19 test was negative. Her son was sick before her. She denies fevers, nasal congestion, and sore throat. She has been taking dayquil which doesn't seem to help.   She has been trying for another pregnancy. She missed a period in July. Home pregnancy test was negative.  She is requesting a blood test for pregnancy.      ROS See pertinent positives and negatives per HPI.    Objective:     BP 124/78   Pulse (!) 54   Temp 97.7 F (36.5 C) (Temporal)   Wt 161 lb 9.6 oz (73.3 kg)   LMP 12/09/2021 (Approximate)   SpO2 100%   BMI 29.56 kg/m    Physical Exam Vitals and nursing note reviewed.  Constitutional:      General: She is not in acute distress.    Appearance: Normal appearance.  HENT:     Head: Normocephalic.  Eyes:     Conjunctiva/sclera: Conjunctivae normal.  Cardiovascular:     Rate and Rhythm: Normal rate and regular rhythm.     Pulses: Normal pulses.     Heart sounds: Normal heart sounds.  Pulmonary:     Effort: Pulmonary effort is normal.     Breath sounds: Normal breath sounds.  Musculoskeletal:     Cervical back: Normal range of motion.  Skin:    General: Skin is warm.  Neurological:     General: No focal deficit present.     Mental Status: She is alert and oriented to person, place, and time.  Psychiatric:        Mood and Affect: Mood  normal.        Behavior: Behavior normal.        Thought Content: Thought content normal.        Judgment: Judgment normal.     Results for orders placed or performed in visit on 12/31/21  POCT glycosylated hemoglobin (Hb A1C)  Result Value Ref Range   Hemoglobin A1C 7.1 (A) 4.0 - 5.6 %   HbA1c POC (<> result, manual entry) 7.1 4.0 - 5.6 %   HbA1c, POC (prediabetic range)     HbA1c, POC (controlled diabetic range) 7.1 (A) 0.0 - 7.0 %  POCT urine pregnancy  Result Value Ref Range   Preg Test, Ur Negative Negative     Assessment & Plan:   Problem List Items Addressed This Visit       Endocrine   Diabetes mellitus without complication (HCC) - Primary    A1c today is controlled at 7.1%.  Continue metformin XR 1500 mg a day.  Encouraged her to schedule an eye exam to get this yearly.  She stopped her Lipitor because she is actively trying to conceive.  Continue current regimen.  Follow-up in 6 months.      Relevant Medications  metFORMIN (GLUCOPHAGE XR) 750 MG 24 hr tablet   Other Relevant Orders   POCT glycosylated hemoglobin (Hb A1C) (Completed)   Hyperlipidemia associated with type 2 diabetes mellitus (HCC)    Chronic, stable.  Continue holding atorvastatin until after pregnancy.      Relevant Medications   metFORMIN (GLUCOPHAGE XR) 750 MG 24 hr tablet   Other Visit Diagnoses     Missed period       Urine pregnancy negative, will check beta hCG.   Relevant Orders   POCT urine pregnancy (Completed)   Beta hCG quant (ref lab)   Acute cough       Symptoms consistent with bronchitis.  Gave a list of medications that are safe to take over-the-counter.  We will have her start loratadine 10 mg daily       Return in about 6 months (around 07/03/2022) for CPE.    Gerre Scull, NP

## 2021-12-31 NOTE — Patient Instructions (Addendum)
It was great to see you!  Start a prenatal vitamin daily.  Continue metformin daily. I recommend you schedule an eye exam yearly.   Start loratadine daily for cough.   Let's follow-up in 6 months, sooner if you have concerns.  If a referral was placed today, you will be contacted for an appointment. Please note that routine referrals can sometimes take up to 3-4 weeks to process. Please call our office if you haven't heard anything after this time frame.  Take care,  Rodman Pickle, NP  Safe Medications in Pregnancy   Acne: Benzoyl Peroxide Salicylic Acid  Backache/Headache: Tylenol: 2 regular strength every 4 hours OR              2 Extra strength every 6 hours  Colds/Coughs/Allergies: Benadryl (alcohol free) 25 mg every 6 hours as needed Breath right strips Claritin Cepacol throat lozenges Chloraseptic throat spray Cold-Eeze- up to three times per day Cough drops, alcohol free Flonase (by prescription only) Guaifenesin Mucinex Robitussin DM (plain only, alcohol free) Saline nasal spray/drops Sudafed (pseudoephedrine) & Actifed ** use only after [redacted] weeks gestation and if you do not have high blood pressure Tylenol Vicks Vaporub Zinc lozenges Zyrtec   Constipation: Colace Ducolax suppositories Fleet enema Glycerin suppositories Metamucil Milk of magnesia Miralax Senokot Smooth move tea  Diarrhea: Kaopectate Imodium A-D  *NO pepto Bismol  Hemorrhoids: Anusol Anusol HC Preparation H Tucks  Indigestion: Tums Maalox Mylanta Zantac  Pepcid  Insomnia: Benadryl (alcohol free) 25mg  every 6 hours as needed Tylenol PM Unisom, no Gelcaps  Leg Cramps: Tums MagGel  Nausea/Vomiting:  Bonine Dramamine Emetrol Ginger extract Sea bands Meclizine  Nausea medication to take during pregnancy:  Unisom (doxylamine succinate 25 mg tablets) Take one tablet daily at bedtime. If symptoms are not adequately controlled, the dose can be increased to a  maximum recommended dose of two tablets daily (1/2 tablet in the morning, 1/2 tablet mid-afternoon and one at bedtime). Vitamin B6 100mg  tablets. Take one tablet twice a day (up to 200 mg per day).  Skin Rashes: Aveeno products Benadryl cream or 25mg  every 6 hours as needed Calamine Lotion 1% cortisone cream  Yeast infection: Gyne-lotrimin 7 Monistat 7   **If taking multiple medications, please check labels to avoid duplicating the same active ingredients **take medication as directed on the label ** Do not exceed 4000 mg of tylenol in 24 hours **Do not take medications that contain aspirin or ibuprofen

## 2022-01-01 LAB — BETA HCG QUANT (REF LAB): hCG Quant: <1 m[IU]/mL

## 2022-01-04 ENCOUNTER — Encounter: Payer: Self-pay | Admitting: Nurse Practitioner

## 2022-01-31 ENCOUNTER — Ambulatory Visit (INDEPENDENT_AMBULATORY_CARE_PROVIDER_SITE_OTHER): Payer: No Typology Code available for payment source | Admitting: Obstetrics and Gynecology

## 2022-01-31 ENCOUNTER — Encounter: Payer: Self-pay | Admitting: Obstetrics and Gynecology

## 2022-01-31 VITALS — BP 123/74 | HR 74 | Ht 62.0 in | Wt 168.0 lb

## 2022-01-31 DIAGNOSIS — Z3169 Encounter for other general counseling and advice on procreation: Secondary | ICD-10-CM | POA: Diagnosis not present

## 2022-01-31 LAB — POCT URINE PREGNANCY: Preg Test, Ur: NEGATIVE

## 2022-01-31 NOTE — Addendum Note (Signed)
Addended by: Mora Bellman on: 01/31/2022 11:06 AM   Modules accepted: Orders

## 2022-01-31 NOTE — Progress Notes (Signed)
Subjective:     Paula Delgado is a 36 y.o. female P1 with LMP 11/08/21 and  BMI 30 who is here for pregnancy test. Patient reports amenorrhea for 2 months. She is requesting a pelvic ultrasound. She states that she had a negative UPT and neg quant HCG with her last pregnancy but ultrasound demonstrated a 6 week viable IUP. Patient is requesting an ultrasound to confirm pregnancy state. Patient reports nausea and food cravings. She is without any other complaints  Past Medical History:  Diagnosis Date   Anxiety    Diabetes mellitus without complication ([place])    Hyperlipidemia    No past surgical history on file. Family History  Problem Relation Age of Onset   Diabetes Mother     Social History   Socioeconomic History   Marital status: Married    Spouse name: Not on file   Number of children: Not on file   Years of education: Not on file   Highest education level: Not on file  Occupational History   Not on file  Tobacco Use   Smoking status: Never   Smokeless tobacco: Never  Vaping Use   Vaping Use: Never used  Substance and Sexual Activity   Alcohol use: Yes   Drug use: Never   Sexual activity: Yes    Birth control/protection: I.U.D.  Other Topics Concern   Not on file  Social History Narrative   Not on file   Social Determinants of Health   Financial Resource Strain: Not on file  Food Insecurity: Not on file  Transportation Needs: Not on file  Physical Activity: Not on file  Stress: Not on file  Social Connections: Not on file  Intimate Partner Violence: Not on file   Health Maintenance  Topic Date Due   FOOT EXAM  Never done   OPHTHALMOLOGY EXAM  Never done   COVID-19 Vaccine (3 - Pfizer series) 10/28/2019   INFLUENZA VACCINE  12/14/2021   HEMOGLOBIN A1C  07/03/2022   Diabetic kidney evaluation - GFR measurement  07/06/2022   Diabetic kidney evaluation - Urine ACR  07/06/2022   PAP SMEAR-Modifier  07/06/2024   TETANUS/TDAP  07/18/2030   Hepatitis C  Screening  Completed   HIV Screening  Completed   HPV VACCINES  Aged Out       Review of Systems Pertinent items noted in HPI and remainder of comprehensive ROS otherwise negative.   Objective:  Blood pressure 123/74, pulse 74, height 5\' 2"  (1.575 m), weight 168 lb (76.2 kg), last menstrual period 11/08/2021.   GENERAL: Well-developed, well-nourished female in no acute distress.  NEURO: alert and oriented x 3     Assessment:    Healthy female exam.      Plan:    Pap smear normal 06/2021 Quant HCG today Pelvic ultrasound per patient request Patient will be contacted with abnormal results Discussed taking prenatal vitamins and ensuring that her diabetes is well controlled before conception See After Visit Summary for Counseling Recommendations

## 2022-02-01 ENCOUNTER — Ambulatory Visit
Admission: RE | Admit: 2022-02-01 | Discharge: 2022-02-01 | Disposition: A | Discharge status: Home / Self Care | Payer: No Typology Code available for payment source | Source: Ambulatory Visit | Attending: Obstetrics and Gynecology | Admitting: Obstetrics and Gynecology

## 2022-02-01 ENCOUNTER — Encounter: Payer: Self-pay | Admitting: Obstetrics and Gynecology

## 2022-02-01 ENCOUNTER — Ambulatory Visit (INDEPENDENT_AMBULATORY_CARE_PROVIDER_SITE_OTHER): Payer: No Typology Code available for payment source | Admitting: Advanced Practice Midwife

## 2022-02-01 DIAGNOSIS — Z3202 Encounter for pregnancy test, result negative: Secondary | ICD-10-CM | POA: Diagnosis not present

## 2022-02-01 DIAGNOSIS — N911 Secondary amenorrhea: Secondary | ICD-10-CM

## 2022-02-01 DIAGNOSIS — Z3169 Encounter for other general counseling and advice on procreation: Secondary | ICD-10-CM | POA: Insufficient documentation

## 2022-02-01 LAB — BETA HCG QUANT (REF LAB): hCG Quant: <1 m[IU]/mL

## 2022-02-01 NOTE — Progress Notes (Unsigned)
Ultrasounds Results Note  SUBJECTIVE HPI:  Ms. Paula Delgado is a 36 y.o. G2P0111 female Patient's last menstrual period was 11/08/2021 (approximate). who presents to [facility] for followup ultrasound results. Was seen at [facility] yesterday: "Patient reports amenorrhea for 2 months. She is requesting a pelvic ultrasound. She states that she had a negative UPT and neg quant HCG with her last pregnancy but ultrasound demonstrated a 6 week viable IUP. Patient is requesting an ultrasound to confirm pregnancy state. Patient reports nausea and food cravings. She is without any other complaints" The patient denies/reports abdominal pain or vaginal bleeding.     Latest Reference Range & Units 12/31/21 10:59 12/31/21 11:39 01/31/22 10:52 01/31/22 11:02  hCG Quant mIU/mL  <1 <1   Preg Test, Ur Negative  Negative   Negative   Ultrasound was performed earlier today.   Past Medical History:  Diagnosis Date  . Anxiety   . Diabetes mellitus without complication ([place])   . Hyperlipidemia    No past surgical history on file. Social History   Socioeconomic History  . Marital status: Married    Spouse name: Not on file  . Number of children: Not on file  . Years of education: Not on file  . Highest education level: Not on file  Occupational History  . Not on file  Tobacco Use  . Smoking status: Never  . Smokeless tobacco: Never  Vaping Use  . Vaping Use: Never used  Substance and Sexual Activity  . Alcohol use: Not Currently  . Drug use: Never  . Sexual activity: Yes  Other Topics Concern  . Not on file  Social History Narrative  . Not on file   Social Determinants of Health   Financial Resource Strain: Not on file  Food Insecurity: Not on file  Transportation Needs: Not on file  Physical Activity: Not on file  Stress: Not on file  Social Connections: Not on file  Intimate Partner Violence: Not on file   Current Outpatient Medications on File Prior to Visit   Medication Sig Dispense Refill  . atorvastatin (LIPITOR) 10 MG tablet Take 1 tablet (10 mg total) by mouth daily. (Patient not taking: Reported on 09/27/2021) 90 tablet 1  . Blood Glucose Monitoring Suppl (ONE TOUCH ULTRA 2) w/Device KIT 1 each by Does not apply route daily. 1 kit 0  . glucose blood (ONETOUCH ULTRA) test strip Use as instructed 100 each 12  . Lancets (ONETOUCH ULTRASOFT) lancets Use as instructed 100 each 12  . loratadine (CLARITIN) 10 MG tablet Take 1 tablet (10 mg total) by mouth daily. 30 tablet 11  . metFORMIN (GLUCOPHAGE XR) 750 MG 24 hr tablet Take 2 tablets (1,500 mg total) by mouth daily with breakfast. 180 tablet 1  . Prenatal Vit-Fe Fumarate-FA (MULTIVITAMIN-PRENATAL) 27-0.8 MG TABS tablet Take 1 tablet by mouth daily at 12 noon. 90 tablet 3   No current facility-administered medications on file prior to visit.   No Known Allergies  I have reviewed patient's Past Medical Hx, Surgical Hx, Family Hx, Social Hx, medications and allergies.   Review of Systems Review of Systems  Constitutional: Negative for fever and chills.  Gastrointestinal: Negative for abdominal pain.  Genitourinary: Negative for vaginal bleeding.  Musculoskeletal: Negative for back pain.  Neurological: Negative for dizziness and weakness.    Physical Exam  LMP 11/08/2021 (Approximate)   Patient's last menstrual period was 11/08/2021 (approximate). GENERAL: Well-developed, well-nourished female in no acute distress.  HEENT: Normocephalic, atraumatic.  LUNGS: Effort normal ABDOMEN: Deferred HEART: Regular rate  SKIN: Warm, dry and without erythema PSYCH: Normal mood and affect NEURO: Alert and oriented x 4  LAB RESULTS Results for orders placed or performed in visit on 01/31/22 (from the past 24 hour(s))  Beta hCG quant (ref lab)     Status: None   Collection Time: 01/31/22 10:52 AM  Result Value Ref Range   hCG Quant <1 mIU/mL   Narrative   Performed at:  [facility] [address] [place], [place]  [id number] Lab Director: Rush Farmer MD, Phone:  [phone]  POCT urine pregnancy     Status: None   Collection Time: 01/31/22 11:02 AM  Result Value Ref Range   Preg Test, Ur Negative Negative    IMAGING US OB LESS THAN 14 WEEKS WITH OB TRANSVAGINAL  Result Date: 02/01/2022 CLINICAL DATA:  Unsure of LMP.  Irregular menses. EXAM: OBSTETRIC <14 WK [place] AND TRANSVAGINAL OB US TECHNIQUE: Both transabdominal and transvaginal ultrasound examinations were performed for complete evaluation of the gestation as well as the maternal uterus, adnexal regions, and pelvic cul-de-sac. Transvaginal technique was performed to assess early pregnancy. COMPARISON:  None Available. FINDINGS: Intrauterine gestational sac: None Maternal uterus/adnexae: Retroflexed uterus. Endometrial thickness measures 7 mm. No fibroids identified. Both ovaries are normal in appearance. No evidence of adnexal mass or abnormal free fluid. IMPRESSION: Pregnancy of unknown anatomic location (no intrauterine gestational sac or adnexal mass identified). Differential diagnosis includes recent spontaneous abortion, IUP too early to visualize, and non-visualized ectopic pregnancy. Recommend correlation with serial beta-hCG levels, and follow up US if warranted clinically. Electronically Signed   By: Marlaine Hind M.D.   On: 02/01/2022 08:41    ASSESSMENT No diagnosis found.  PLAN {F/U:19197::"Start prenatal care with provider of your choice","Go to MAU"} List of Ob/Gyn providers given. Repeat US *** Repeat Quant Hcg *** Patient advised to start/continue taking prenatal vitamins Go to MAU as needed for heavy bleeding, abdominal pain or fever greater than 100.4.  [place], [place] 02/01/2022 8:49 AM

## 2022-02-02 DIAGNOSIS — N911 Secondary amenorrhea: Secondary | ICD-10-CM | POA: Insufficient documentation

## 2022-10-18 ENCOUNTER — Ambulatory Visit: Payer: No Typology Code available for payment source | Admitting: Obstetrics and Gynecology

## 2023-03-13 ENCOUNTER — Ambulatory Visit (INDEPENDENT_AMBULATORY_CARE_PROVIDER_SITE_OTHER): Payer: No Typology Code available for payment source | Admitting: Nurse Practitioner

## 2023-03-13 ENCOUNTER — Encounter: Payer: Self-pay | Admitting: Nurse Practitioner

## 2023-03-13 VITALS — BP 110/80 | HR 56 | Temp 97.4°F | Ht 62.0 in | Wt 161.8 lb

## 2023-03-13 DIAGNOSIS — E119 Type 2 diabetes mellitus without complications: Secondary | ICD-10-CM

## 2023-03-13 DIAGNOSIS — R079 Chest pain, unspecified: Secondary | ICD-10-CM

## 2023-03-13 DIAGNOSIS — F419 Anxiety disorder, unspecified: Secondary | ICD-10-CM

## 2023-03-13 DIAGNOSIS — Z7984 Long term (current) use of oral hypoglycemic drugs: Secondary | ICD-10-CM | POA: Diagnosis not present

## 2023-03-13 LAB — CBC WITH DIFFERENTIAL/PLATELET
Basophils Absolute: 0 10*3/uL (ref 0.0–0.1)
Basophils Relative: 0.5 % (ref 0.0–3.0)
Eosinophils Absolute: 0.2 10*3/uL (ref 0.0–0.7)
Eosinophils Relative: 3.4 % (ref 0.0–5.0)
HCT: 43.1 % (ref 36.0–46.0)
Hemoglobin: 13.4 g/dL (ref 12.0–15.0)
Lymphocytes Relative: 24.6 % (ref 12.0–46.0)
Lymphs Abs: 1.6 10*3/uL (ref 0.7–4.0)
MCHC: 31 g/dL (ref 30.0–36.0)
MCV: 82.7 fL (ref 78.0–100.0)
Monocytes Absolute: 0.5 10*3/uL (ref 0.1–1.0)
Monocytes Relative: 7.3 % (ref 3.0–12.0)
Neutro Abs: 4.2 10*3/uL (ref 1.4–7.7)
Neutrophils Relative %: 64.2 % (ref 43.0–77.0)
Platelets: 212 10*3/uL (ref 150.0–400.0)
RBC: 5.21 Mil/uL — ABNORMAL HIGH (ref 3.87–5.11)
RDW: 13.8 % (ref 11.5–15.5)
WBC: 6.5 10*3/uL (ref 4.0–10.5)

## 2023-03-13 LAB — LIPID PANEL
Cholesterol: 191 mg/dL (ref 0–200)
HDL: 59.3 mg/dL (ref >39.00)
LDL Cholesterol: 113 mg/dL — ABNORMAL HIGH (ref 0–99)
NonHDL: 132.13
Total CHOL/HDL Ratio: 3
Triglycerides: 98 mg/dL (ref 0.0–149.0)
VLDL: 19.6 mg/dL (ref 0.0–40.0)

## 2023-03-13 LAB — COMPREHENSIVE METABOLIC PANEL
ALT: 10 U/L (ref 0–35)
AST: 11 U/L (ref 0–37)
Albumin: 4.3 g/dL (ref 3.5–5.2)
Alkaline Phosphatase: 40 U/L (ref 39–117)
BUN: 14 mg/dL (ref 6–23)
CO2: 29 meq/L (ref 19–32)
Calcium: 9.6 mg/dL (ref 8.4–10.5)
Chloride: 101 meq/L (ref 96–112)
Creatinine, Ser: 0.69 mg/dL (ref 0.40–1.20)
GFR: 109.59 mL/min (ref >60.00)
Glucose, Bld: 146 mg/dL — ABNORMAL HIGH (ref 70–99)
Potassium: 4.3 meq/L (ref 3.5–5.1)
Sodium: 137 meq/L (ref 135–145)
Total Bilirubin: 0.4 mg/dL (ref 0.2–1.2)
Total Protein: 7.1 g/dL (ref 6.0–8.3)

## 2023-03-13 LAB — MICROALBUMIN / CREATININE URINE RATIO
Creatinine,U: 107.9 mg/dL
Microalb Creat Ratio: 0.6 mg/g (ref 0.0–30.0)
Microalb, Ur: <0.7 mg/dL (ref 0.0–1.9)

## 2023-03-13 LAB — HEMOGLOBIN A1C: Hgb A1c MFr Bld: 7.6 % — ABNORMAL HIGH (ref 4.6–6.5)

## 2023-03-13 LAB — TSH: TSH: 1 u[IU]/mL (ref 0.35–5.50)

## 2023-03-13 MED ORDER — METFORMIN HCL ER 750 MG PO TB24: 1500.0000 mg | ORAL_TABLET | Freq: Every day | ORAL | 1 refills | Status: DC

## 2023-03-13 MED ORDER — PRENATAL 27-0.8 MG PO TABS: 1.0000 | ORAL_TABLET | Freq: Every day | ORAL | 3 refills | Status: DC

## 2023-03-13 NOTE — Progress Notes (Signed)
EKG interpreted by me on 03/13/23 showed normal sinus rhythm with a heart rate of 60. No ST changes.

## 2023-03-13 NOTE — Progress Notes (Signed)
Established Patient Office Visit  Subjective   Patient ID: Paula Delgado, female    DOB: [birth year]  Age: 37 y.o. MRN: [id number]  Chief Complaint  Patient presents with   Pain    Pain in upper chest and left shoulder since last week, stopped medication medication for 2 months, request lab work    HPI  Discussed the use of AI scribe software for clinical note transcription with the patient, who gave verbal consent to proceed.  History of Present Illness   The patient, with a history of diabetes, presents with left shoulder and chest pain that started a week ago. The pain was described as sharp and sore, similar to a bruise. The patient reported that the pain was severe for two days but has since calmed down. The patient has tried Tylenol and patches for pain relief with minimal improvement. The patient denies any injury that could have caused the pain. The patient also reports experiencing tightness in the chest.  In addition to the pain, the patient has been experiencing anxiety and stress due to work. The patient denies having panic attacks but reports having moments of anxiety where she can't stay still. The patient also reports trouble sleeping, attributing it to overthinking and possibly her mattress.  The patient also discusses irregular menstrual cycles and a desire to conceive. The patient had an IUD removed and has not started any birth control pills. The patient is open to the idea of getting pregnant but also expresses concern about being pregnant at 35.  The patient has stopped taking metformin for diabetes and is considering restarting it due to concerns about chest pain. The patient is not currently taking any cholesterol medication or prenatal vitamins. She denies shortness of breath and numbness and tingling in her feet.        03/13/2023    8:56 AM 07/06/2021   10:14 AM 07/06/2021    9:55 AM 03/06/2019    9:33 AM  Depression screen PHQ 2/9  Decreased Interest 1 1  0 0  Down, Depressed, Hopeless 1 1 0 0  PHQ - 2 Score 2 2 0 0  Altered sleeping 0 2    Tired, decreased energy 0 2    Change in appetite 1 2    Feeling bad or failure about yourself  1 2    Trouble concentrating 0 1    Moving slowly or fidgety/restless 0 0    Suicidal thoughts 0 0    PHQ-9 Score 4 11    Difficult doing work/chores Not difficult at all Somewhat difficult        03/13/2023    8:59 AM 07/06/2021   10:14 AM  GAD 7 : Generalized Anxiety Score  Nervous, Anxious, on Edge 0 1  Control/stop worrying 1 2  Worry too much - different things 1 1  Trouble relaxing 2 2  Restless 1 1  Easily annoyed or irritable 1 2  Afraid - awful might happen 0 0  Total GAD 7 Score 6 9  Anxiety Difficulty Somewhat difficult Somewhat difficult      ROS See pertinent positives and negatives per HPI.    Objective:     BP 110/80 (BP Location: Left Arm)   Pulse (!) 56   Temp (!) 97.4 F (36.3 C)   Ht 5\' 2"  (1.575 m)   Wt 161 lb 12.8 oz (73.4 kg)   LMP 02/05/2023 (Approximate)   SpO2 98%   BMI 29.59 kg/m    Physical  Exam Vitals and nursing note reviewed.  Constitutional:      General: She is not in acute distress.    Appearance: Normal appearance.  HENT:     Head: Normocephalic.  Eyes:     Conjunctiva/sclera: Conjunctivae normal.  Cardiovascular:     Rate and Rhythm: Normal rate and regular rhythm.     Pulses: Normal pulses.     Heart sounds: Normal heart sounds.  Pulmonary:     Effort: Pulmonary effort is normal.     Breath sounds: Normal breath sounds.  Musculoskeletal:        General: No swelling or tenderness. Normal range of motion.     Cervical back: Normal range of motion.  Skin:    General: Skin is warm.  Neurological:     General: No focal deficit present.     Mental Status: She is alert and oriented to person, place, and time.  Psychiatric:        Mood and Affect: Mood normal.        Behavior: Behavior normal.        Thought Content: Thought content  normal.        Judgment: Judgment normal.      Assessment & Plan:   Problem List Items Addressed This Visit       Endocrine   Diabetes mellitus without complication (HCC) - Primary    She stopped taking Metformin but now wishes to restart it due to concerns about blood sugar control. We will restart Metformin XL 750mg  twice daily and order labs to assess current A1C, CMP, CBC, urine microalbumin, and lipid panel. Continue not taking atorvastatin since she is trying for pregnancy. Recommend yearly eye exam. Foot exam normal today.       Relevant Medications   metFORMIN (GLUCOPHAGE XR) 750 MG 24 hr tablet   Other Relevant Orders   CBC with Differential/Platelet   Comprehensive metabolic panel   Lipid panel   Hemoglobin A1c   Microalbumin / creatinine urine ratio     Other   Anxiety    She describes increased stress and anxiety, noting difficulty staying still and trouble sleeping, without experiencing panic attacks. She has expressed an interest in therapy. Will place referral to therapy today.       Relevant Orders   Ambulatory referral to Psychology   Chest pain    She reports new onset left-sided chest and shoulder pain, characterized as sharp and sore without clear precipitating factors. The pain, intermittent and partially relieved by Tylenol, lacks associated shortness of breath. EKG showed normal sinus rhythm with a heart rate of 60, no ST changes.  Continue tylenol as needed for pain. Can use ice/heat.       Relevant Orders   EKG 12-Lead (Completed)   TSH    Return in about 6 months (around 09/11/2023) for Diabetes.    Gerre Scull, NP

## 2023-03-13 NOTE — Patient Instructions (Signed)
It was great to see you!  We are checking your labs today and will let you know the results via mychart/phone.   I have refilled your metformin  Stop the lipitor for now   Start a prenatal vitamin daily, I sent this to your pharmacy.   Let's follow-up in 6 months, sooner if you have concerns.  If a referral was placed today, you will be contacted for an appointment. Please note that routine referrals can sometimes take up to 3-4 weeks to process. Please call our office if you haven't heard anything after this time frame.  Take care,  Rodman Pickle, NP

## 2023-03-13 NOTE — Assessment & Plan Note (Signed)
She describes increased stress and anxiety, noting difficulty staying still and trouble sleeping, without experiencing panic attacks. She has expressed an interest in therapy. Will place referral to therapy today.

## 2023-03-13 NOTE — Assessment & Plan Note (Signed)
She stopped taking Metformin but now wishes to restart it due to concerns about blood sugar control. We will restart Metformin XL 750mg  twice daily and order labs to assess current A1C, CMP, CBC, urine microalbumin, and lipid panel. Continue not taking atorvastatin since she is trying for pregnancy. Recommend yearly eye exam. Foot exam normal today.

## 2023-03-13 NOTE — Assessment & Plan Note (Addendum)
She reports new onset left-sided chest and shoulder pain, characterized as sharp and sore without clear precipitating factors. The pain, intermittent and partially relieved by Tylenol, lacks associated shortness of breath. EKG showed normal sinus rhythm with a heart rate of 60, no ST changes.  Continue tylenol as needed for pain. Can use ice/heat.

## 2023-08-13 ENCOUNTER — Emergency Department (HOSPITAL_COMMUNITY)
Admission: EM | Admit: 2023-08-13 | Discharge: 2023-08-13 | Disposition: A | Discharge status: Home / Self Care | Attending: Emergency Medicine | Admitting: Emergency Medicine

## 2023-08-13 ENCOUNTER — Emergency Department (HOSPITAL_COMMUNITY)

## 2023-08-13 ENCOUNTER — Other Ambulatory Visit: Payer: Self-pay

## 2023-08-13 ENCOUNTER — Encounter (HOSPITAL_COMMUNITY): Payer: Self-pay

## 2023-08-13 DIAGNOSIS — Z7984 Long term (current) use of oral hypoglycemic drugs: Secondary | ICD-10-CM | POA: Diagnosis not present

## 2023-08-13 DIAGNOSIS — U071 COVID-19: Secondary | ICD-10-CM | POA: Insufficient documentation

## 2023-08-13 DIAGNOSIS — Z794 Long term (current) use of insulin: Secondary | ICD-10-CM | POA: Diagnosis not present

## 2023-08-13 DIAGNOSIS — E119 Type 2 diabetes mellitus without complications: Secondary | ICD-10-CM | POA: Insufficient documentation

## 2023-08-13 DIAGNOSIS — R059 Cough, unspecified: Secondary | ICD-10-CM | POA: Diagnosis present

## 2023-08-13 LAB — RESP PANEL BY RT-PCR (RSV, FLU A&B, COVID)  RVPGX2
Influenza A by PCR: NEGATIVE
Influenza B by PCR: NEGATIVE
Resp Syncytial Virus by PCR: NEGATIVE
SARS Coronavirus 2 by RT PCR: POSITIVE — AB

## 2023-08-13 MED ORDER — OXYMETAZOLINE HCL 0.05 % NA SOLN: 1.0000 | Freq: Two times a day (BID) | NASAL | 0 refills | Status: AC

## 2023-08-13 MED ORDER — ACETAMINOPHEN 325 MG PO TABS: 650.0000 mg | ORAL_TABLET | Freq: Four times a day (QID) | ORAL | 0 refills | Status: AC | PRN

## 2023-08-13 MED ORDER — GUAIFENESIN-DM 100-10 MG/5ML PO SYRP: 5.0000 mL | ORAL_SOLUTION | ORAL | 0 refills | Status: DC | PRN

## 2023-08-13 MED ORDER — IBUPROFEN 600 MG PO TABS: 600.0000 mg | ORAL_TABLET | Freq: Four times a day (QID) | ORAL | 0 refills | Status: DC | PRN

## 2023-08-13 NOTE — ED Triage Notes (Signed)
 Pt c/o productive cough w/yellow/green mucous and left sided rib painx2d.

## 2023-08-13 NOTE — Discharge Instructions (Addendum)
 It was a pleasure caring for you today in the emergency department.  Please return to the emergency department for any worsening or worrisome symptoms.

## 2023-08-13 NOTE — ED Provider Notes (Signed)
 [place] EMERGENCY DEPARTMENT AT [facility] Provider Note  CSN: [id number] Arrival date & time: 08/13/23 6440  Chief Complaint(s) Cough  HPI Paula Delgado is a 38 y.o. female with past medical history as below, significant for HDL, DM who presents to the ED with complaint of cough, uri  Onset 2 days ago, cough with white/occ yellow phlegm, body aches, poor appetite, low grade temp at home. No sick contacts or recent travel in last week. No vomiting, no dyspnea.    Past Medical History Past Medical History:  Diagnosis Date   Anxiety    Diabetes mellitus without complication (HCC)    Hyperlipidemia    Patient Active Problem List   Diagnosis Date Noted   Chest pain 03/13/2023   Secondary amenorrhea 02/02/2022   Diabetes mellitus without complication (HCC) 07/06/2021   Hyperlipidemia associated with type 2 diabetes mellitus (HCC) 07/06/2021   Birth control counseling 07/06/2021   Anxiety 07/06/2021   Obesity (BMI 30-39.9) 07/06/2021   Home Medication(s) Prior to Admission medications   Medication Sig Start Date End Date Taking? Authorizing Provider  Blood Glucose Monitoring Suppl (ONE TOUCH ULTRA 2) w/Device KIT 1 each by Does not apply route daily. Patient not taking: Reported on 03/13/2023 12/31/21   Gerre Scull, NP  glucose blood (ONETOUCH ULTRA) test strip Use as instructed Patient not taking: Reported on 03/13/2023 12/31/21   Gerre Scull, NP  Lancets (ONETOUCH ULTRASOFT) lancets Use as instructed Patient not taking: Reported on 03/13/2023 12/31/21   Gerre Scull, NP  loratadine (CLARITIN) 10 MG tablet Take 1 tablet (10 mg total) by mouth daily. Patient not taking: Reported on 03/13/2023 12/31/21   Gerre Scull, NP  metFORMIN (GLUCOPHAGE XR) 750 MG 24 hr tablet Take 2 tablets (1,500 mg total) by mouth daily with breakfast. 03/13/23   McElwee, Lauren A, NP  Prenatal Vit-Fe Fumarate-FA (MULTIVITAMIN-PRENATAL) 27-0.8 MG TABS tablet Take 1  tablet by mouth daily at 12 noon. 03/13/23   Gerre Scull, NP                                                                                                                                    Past Surgical History History reviewed. No pertinent surgical history. Family History Family History  Problem Relation Age of Onset   Diabetes Mother     Social History Social History   Tobacco Use   Smoking status: Never   Smokeless tobacco: Never  Vaping Use   Vaping status: Never Used  Substance Use Topics   Alcohol use: Not Currently   Drug use: Never   Allergies Patient has no known allergies.  Review of Systems A thorough review of systems was obtained and all systems are negative except as noted in the HPI and PMH.   Physical Exam Vital Signs  I have reviewed the triage vital signs BP 130/82 (BP Location: Right Arm)   Pulse 98  Temp 99.2 F (37.3 C) (Oral)   Resp 16   Ht 5\' 2"  (1.575 m)   Wt 73.4 kg   LMP 08/10/2023   SpO2 98%   BMI 29.60 kg/m  Physical Exam Vitals and nursing note reviewed.  Constitutional:      General: She is not in acute distress.    Appearance: Normal appearance.  HENT:     Head: Normocephalic and atraumatic.     Right Ear: External ear normal.     Left Ear: External ear normal.     Nose: Nose normal.     Mouth/Throat:     Mouth: Mucous membranes are moist.  Eyes:     General: No scleral icterus.       Right eye: No discharge.        Left eye: No discharge.  Cardiovascular:     Rate and Rhythm: Normal rate and regular rhythm.     Pulses: Normal pulses.     Heart sounds: Normal heart sounds.  Pulmonary:     Effort: Pulmonary effort is normal. No respiratory distress.     Breath sounds: Normal breath sounds. No stridor.  Abdominal:     General: Abdomen is flat. There is no distension.     Palpations: Abdomen is soft.     Tenderness: There is no abdominal tenderness.  Musculoskeletal:     Cervical back: No rigidity.     Right  lower leg: No edema.     Left lower leg: No edema.  Skin:    General: Skin is warm and dry.     Capillary Refill: Capillary refill takes less than 2 seconds.  Neurological:     Mental Status: She is alert.  Psychiatric:        Mood and Affect: Mood normal.        Behavior: Behavior normal. Behavior is cooperative.     ED Results and Treatments Labs (all labs ordered are listed, but only abnormal results are displayed) Labs Reviewed  RESP PANEL BY RT-PCR (RSV, FLU A&B, COVID)  RVPGX2 - Abnormal; Notable for the following components:      Result Value   SARS Coronavirus 2 by RT PCR POSITIVE (*)    All other components within normal limits                                                                                                                          Radiology DG Chest 2 View Result Date: 08/13/2023 CLINICAL DATA:  Cough. EXAM: CHEST - 2 VIEW COMPARISON:  08/08/2017 FINDINGS: Heart size and mediastinal contours appear normal. No pleural fluid, interstitial edema or airspace consolidation. Central airway thickening noted. Visualized osseous structures are unremarkable. IMPRESSION: Central airway thickening compatible with bronchitis or reactive airways disease. Electronically Signed   By: Signa Kell M.D.   On: 08/13/2023 10:05    Pertinent labs & imaging results that were available during my care of the patient were reviewed by me and  considered in my medical decision making (see MDM for details).  Medications Ordered in ED Medications - No data to display                                                                                                                                   Procedures Procedures  (including critical care time)  Medical Decision Making / ED Course    Medical Decision Making:    Paula Delgado is a 38 y.o. female with past medical history as below, significant for HDL, DM who presents to the ED with complaint of cough, uri. The  complaint involves an extensive differential diagnosis and also carries with it a high risk of complications and morbidity.  Serious etiology was considered. Ddx includes but is not limited to: viral uri, pna, covid, flu, pharyngitis, etc  Complete initial physical exam performed, notably the patient was in no distress, no hypoxia.    Reviewed and confirmed nursing documentation for past medical history, family history, social history.  Vital signs reviewed.     Clinical Course as of 08/13/23 1115  Sun Aug 13, 2023  1007 X-ray reviewed on my interpretation does not appear to evidence of acute infiltrate, pneumothorax or widened mediastinum. [SG]  1057 SARS Coronavirus 2 by RT PCR(!): POSITIVE [SG]    Clinical Course User Index [SG] Sloan Leiter, DO    Brief summary: 38 yo female here w/ uri symptoms, no hypoxia, no abd pain or cp. She has covid, no pna on cxr. Will d/c w/ supportive care and strict return precautions    The patient improved significantly and was discharged in stable condition. Detailed discussions were had with the patient/guardian regarding current findings, and need for close f/u with PCP or on call doctor. The patient/guardian has been instructed to return immediately if the symptoms worsen in any way for re-evaluation. Patient/guardian verbalized understanding and is in agreement with current care plan. All questions answered prior to discharge.              Additional history obtained: -Additional history obtained from na -External records from outside source obtained and reviewed including: Chart review including previous notes, labs, imaging, consultation notes including  Primary care documentation, home medications   Lab Tests: -I ordered, reviewed, and interpreted labs.   The pertinent results include:   Labs Reviewed  RESP PANEL BY RT-PCR (RSV, FLU A&B, COVID)  RVPGX2 - Abnormal; Notable for the following components:      Result Value   SARS  Coronavirus 2 by RT PCR POSITIVE (*)    All other components within normal limits    Notable for covid 19  EKG   EKG Interpretation Date/Time:    Ventricular Rate:    PR Interval:    QRS Duration:    QT Interval:    QTC Calculation:   R Axis:      Text Interpretation:  Imaging Studies ordered: I ordered imaging studies including cxr I independently visualized the following imaging with scope of interpretation limited to determining acute life threatening conditions related to emergency care; findings noted above I independently visualized and interpreted imaging. I agree with the radiologist interpretation   Medicines ordered and prescription drug management: No orders of the defined types were placed in this encounter.   -I have reviewed the patients home medicines and have made adjustments as needed   Consultations Obtained: na   Cardiac Monitoring: Continuous pulse oximetry interpreted by myself, 99% on RA.    Social Determinants of Health:  Diagnosis or treatment significantly limited by social determinants of health: na   Reevaluation: After the interventions noted above, I reevaluated the patient and found that they have improved  Co morbidities that complicate the patient evaluation  Past Medical History:  Diagnosis Date   Anxiety    Diabetes mellitus without complication (HCC)    Hyperlipidemia       Dispostion: Disposition decision including need for hospitalization was considered, and patient discharged from emergency department.    Final Clinical Impression(s) / ED Diagnoses Final diagnoses:  COVID-19        Sloan Leiter, DO 08/13/23 1115

## 2023-08-14 ENCOUNTER — Telehealth: Payer: Self-pay

## 2023-08-14 NOTE — Transitions of Care (Post Inpatient/ED Visit) (Signed)
   08/14/2023  Name: Paula Delgado MRN: [id number] DOB: 07-Dec-1985  Today's TOC FU Call Status: Today's TOC FU Call Status:: Unsuccessful Call (1st Attempt) Unsuccessful Call (1st Attempt) Date: 08/14/23  Attempted to reach the patient regarding the most recent Inpatient/ED visit.  Follow Up Plan: Additional outreach attempts will be made to reach the patient to complete the Transitions of Care (Post Inpatient/ED visit) call.   Signature Jodelle Green, RMA

## 2023-08-15 NOTE — Transitions of Care (Post Inpatient/ED Visit) (Signed)
   08/15/2023  Name: Paula Delgado MRN: [id number] DOB: Apr 11, 1986  Today's TOC FU Call Status: Today's TOC FU Call Status:: Unsuccessful Call (2nd Attempt) Unsuccessful Call (1st Attempt) Date: 08/14/23 Unsuccessful Call (2nd Attempt) Date: 08/15/23  Attempted to reach the patient regarding the most recent Inpatient/ED visit.  Follow Up Plan: Additional outreach attempts will be made to reach the patient to complete the Transitions of Care (Post Inpatient/ED visit) call.   Signature Jodelle Green, RMA

## 2023-08-16 NOTE — Transitions of Care (Post Inpatient/ED Visit) (Signed)
   08/16/2023  Name: Paula Delgado Reasons MRN: [id number] DOB: Jun 15, 1985  Today's TOC FU Call Status: Today's TOC FU Call Status:: Unsuccessful Call (3rd Attempt) Unsuccessful Call (1st Attempt) Date: 08/14/23 Unsuccessful Call (2nd Attempt) Date: 08/15/23 Unsuccessful Call (3rd Attempt) Date: 08/16/23  Attempted to reach the patient regarding the most recent Inpatient/ED visit.  Follow Up Plan: No further outreach attempts will be made at this time. We have been unable to contact the patient.  Signature Jodelle Green, RMA

## 2023-09-11 ENCOUNTER — Encounter: Payer: Self-pay | Admitting: Nurse Practitioner

## 2023-09-11 ENCOUNTER — Ambulatory Visit (INDEPENDENT_AMBULATORY_CARE_PROVIDER_SITE_OTHER): Admitting: Nurse Practitioner

## 2023-09-11 VITALS — BP 100/62 | HR 57 | Temp 97.1°F | Ht 62.0 in | Wt 160.8 lb

## 2023-09-11 DIAGNOSIS — Z7984 Long term (current) use of oral hypoglycemic drugs: Secondary | ICD-10-CM | POA: Insufficient documentation

## 2023-09-11 DIAGNOSIS — N926 Irregular menstruation, unspecified: Secondary | ICD-10-CM | POA: Diagnosis not present

## 2023-09-11 DIAGNOSIS — E119 Type 2 diabetes mellitus without complications: Secondary | ICD-10-CM | POA: Diagnosis not present

## 2023-09-11 DIAGNOSIS — J309 Allergic rhinitis, unspecified: Secondary | ICD-10-CM | POA: Insufficient documentation

## 2023-09-11 LAB — BASIC METABOLIC PANEL WITH GFR
BUN: 17 mg/dL (ref 6–23)
CO2: 25 meq/L (ref 19–32)
Calcium: 9.6 mg/dL (ref 8.4–10.5)
Chloride: 102 meq/L (ref 96–112)
Creatinine, Ser: 0.64 mg/dL (ref 0.40–1.20)
GFR: 111.21 mL/min (ref >60.00)
Glucose, Bld: 132 mg/dL — ABNORMAL HIGH (ref 70–99)
Potassium: 4 meq/L (ref 3.5–5.1)
Sodium: 136 meq/L (ref 135–145)

## 2023-09-11 LAB — HEMOGLOBIN A1C: Hgb A1c MFr Bld: 8.1 % — ABNORMAL HIGH (ref 4.6–6.5)

## 2023-09-11 LAB — TSH: TSH: 1.25 u[IU]/mL (ref 0.35–5.50)

## 2023-09-11 MED ORDER — ONETOUCH ULTRA 2 W/DEVICE KIT: 1.0000 | PACK | Freq: Every day | 0 refills | Status: AC

## 2023-09-11 MED ORDER — LORATADINE 10 MG PO TABS: 10.0000 mg | ORAL_TABLET | Freq: Every day | ORAL | 3 refills | Status: AC

## 2023-09-11 MED ORDER — METFORMIN HCL ER 750 MG PO TB24: 1500.0000 mg | ORAL_TABLET | Freq: Every day | ORAL | 1 refills | Status: DC

## 2023-09-11 MED ORDER — METFORMIN HCL ER 500 MG PO TB24: 2000.0000 mg | ORAL_TABLET | Freq: Every day | ORAL | 0 refills | Status: DC

## 2023-09-11 NOTE — Assessment & Plan Note (Signed)
 Chronic, stable. Type 2 diabetes is managed with metformin  XR 750mg  daily, and her blood pressure is controlled. She has no neuropathy symptoms and is not monitoring blood sugars at home. Order a glucometer for home monitoring and refill metformin . Check A1c and BMP today and recommend an annual eye exam.

## 2023-09-11 NOTE — Patient Instructions (Signed)
 It was great to see you!  We are checking your labs today and will let you know the results via mychart/phone.   Start claritin  once a day to help with allergies   Let's follow-up in 6 months, sooner if you have concerns.  If a referral was placed today, you will be contacted for an appointment. Please note that routine referrals can sometimes take up to 3-4 weeks to process. Please call our office if you haven't heard anything after this time frame.  Take care,  Rheba Cedar, NP

## 2023-09-11 NOTE — Assessment & Plan Note (Signed)
Continue metformin XR 750 mg daily.

## 2023-09-11 NOTE — Addendum Note (Signed)
 Addended by: Ellyce Lafevers A on: 09/11/2023 02:38 PM   Modules accepted: Orders

## 2023-09-11 NOTE — Progress Notes (Signed)
 Established Patient Office Visit  Subjective   Patient ID: Paula Delgado, female    DOB: 1985-11-25  Age: 38 y.o. MRN: [id number]  Chief Complaint  Patient presents with   Request Lab work    Allergy test and A1C check, irregular menstrual cycles-patient is fasting    HPI  Discussed the use of AI scribe software for clinical note transcription with the patient, who gave verbal consent to proceed.  History of Present Illness   The patient, with a history of diabetes and recent COVID-19 infection, presents with symptoms suggestive of allergies for the past two months. She describes a stuffy and runny nose, along with a cough. She suspects these symptoms may be related to her new work environment, which involves exposure to cut doors and carpeting. She also mentions that others in her office have been sick, suggesting a possible common environmental trigger. She has not been taking anything for her symptoms.   In addition, the patient reports irregular menstrual cycles since the removal of her Mirena IUD. She has been trying to conceive for the past two to three months without success. Her cycles are sometimes a month late and last for about three days. She denies any excessive hair growth on her chin or chest area. She has recently started taking prenatal vitamins.  The patient also mentions a history of diabetes but is not currently monitoring her blood sugars at home. She is due for a refill of her metformin . She denies any chest pain, shortness of breath (apart from during her recent COVID-19 infection), numbness or tingling in her feet, and stomach pain or constipation. She has not had an eye exam in the past year.        ROS See pertinent positives and negatives per HPI.    Objective:     BP 100/62 (BP Location: Left Arm, Patient Position: Sitting, Cuff Size: Normal)   Pulse (!) 57   Temp (!) 97.1 F (36.2 C)   Ht 5\' 2"  (1.575 m)   Wt 160 lb 12.8 oz (72.9 kg)   LMP  08/10/2023 (Exact Date)   SpO2 97%   BMI 29.41 kg/m  BP Readings from Last 3 Encounters:  09/11/23 100/62  08/13/23 130/82  03/13/23 110/80   Wt Readings from Last 3 Encounters:  09/11/23 160 lb 12.8 oz (72.9 kg)  08/13/23 161 lb 13.1 oz (73.4 kg)  03/13/23 161 lb 12.8 oz (73.4 kg)      Physical Exam Vitals and nursing note reviewed.  Constitutional:      General: She is not in acute distress.    Appearance: Normal appearance.  HENT:     Head: Normocephalic.  Eyes:     Conjunctiva/sclera: Conjunctivae normal.  Cardiovascular:     Rate and Rhythm: Normal rate and regular rhythm.     Pulses: Normal pulses.     Heart sounds: Normal heart sounds.  Pulmonary:     Effort: Pulmonary effort is normal.     Breath sounds: Normal breath sounds.  Abdominal:     Palpations: Abdomen is soft.     Tenderness: There is no abdominal tenderness.  Musculoskeletal:     Cervical back: Normal range of motion and neck supple. No tenderness.  Lymphadenopathy:     Cervical: No cervical adenopathy.  Skin:    General: Skin is warm.  Neurological:     General: No focal deficit present.     Mental Status: She is alert and oriented to person, place, and time.  Psychiatric:        Mood and Affect: Mood normal.        Behavior: Behavior normal.        Thought Content: Thought content normal.        Judgment: Judgment normal.       Assessment & Plan:   Problem List Items Addressed This Visit       Respiratory   Allergic rhinitis   She experiences persistent allergic rhinitis symptoms, possibly due to environmental factors at work. Claritin  is safe during pregnancy. Start Claritin  10mg  once daily. Will complete allergy testing per request.       Relevant Orders   Resp Allergy Profile Regn2DC [place] MD [facility] [facility]     Endocrine   Diabetes mellitus without complication (HCC) - Primary   Chronic, stable. Type 2 diabetes is managed with metformin  XR 750mg  daily, and her blood pressure is  controlled. She has no neuropathy symptoms and is not monitoring blood sugars at home. Order a glucometer for home monitoring and refill metformin . Check A1c and BMP today and recommend an annual eye exam.      Relevant Medications   metFORMIN  (GLUCOPHAGE  XR) 750 MG 24 hr tablet   Other Relevant Orders   Basic metabolic panel with GFR   Hemoglobin A1c     Other   Long term current use of oral hypoglycemic drug   Continue metformin  XR 750mg  daily.       Irregular menstrual cycle   Irregular cycles have occurred since Mirena removal, and she has been attempting conception for 2-3 months. Metformin  may aid ovulation. Intercourse timing and ovulation kits were discussed. Continue metformin  for ovulation support. Consult a gynecologist for further evaluation. Advise intercourse timing 11-14 days post-menstrual period. Check TSH today.       Relevant Orders   TSH    Return in about 6 months (around 03/12/2024) for CPE.    Odette Benjamin, NP

## 2023-09-11 NOTE — Assessment & Plan Note (Signed)
 Irregular cycles have occurred since Mirena removal, and she has been attempting conception for 2-3 months. Metformin  may aid ovulation. Intercourse timing and ovulation kits were discussed. Continue metformin  for ovulation support. Consult a gynecologist for further evaluation. Advise intercourse timing 11-14 days post-menstrual period. Check TSH today.

## 2023-09-11 NOTE — Assessment & Plan Note (Signed)
 She experiences persistent allergic rhinitis symptoms, possibly due to environmental factors at work. Claritin  is safe during pregnancy. Start Claritin  10mg  once daily. Will complete allergy testing per request.

## 2023-09-14 LAB — RESPIRATORY ALLERGY PROFILE REGION II ~~LOC~~
Allergen, A. alternata, m6: <0.1 kU/L
Allergen, Cedar tree, t12: 0.18 kU/L — ABNORMAL HIGH
Allergen, Comm Silver Birch, t9: 0.26 kU/L — ABNORMAL HIGH
Allergen, Cottonwood, t14: 0.25 kU/L — ABNORMAL HIGH
Allergen, D pternoyssinus,d7: <0.1 kU/L
Allergen, Mouse Urine Protein, e78: <0.1 kU/L
Allergen, Mulberry, t76: 0.15 kU/L — ABNORMAL HIGH
Allergen, Oak,t7: 0.21 kU/L — ABNORMAL HIGH
Allergen, P. notatum, m1: <0.1 kU/L
Aspergillus fumigatus, m3: <0.1 kU/L
Bermuda Grass: 0.16 kU/L — ABNORMAL HIGH
Box Elder IgE: 0.21 kU/L — ABNORMAL HIGH
CLADOSPORIUM HERBARUM (M2) IGE: <0.1 kU/L
COMMON RAGWEED (SHORT) (W1) IGE: 0.19 kU/L — ABNORMAL HIGH
Cat Dander: <0.1 kU/L
Class: 0
Class: 0
Class: 0
Class: 0
Class: 0
Class: 0
Class: 0
Class: 0
Class: 0
Class: 0
Class: 0
Class: 2
Cockroach: <0.1 kU/L
D. farinae: <0.1 kU/L
Dog Dander: 2.2 kU/L — ABNORMAL HIGH
Elm IgE: 0.26 kU/L — ABNORMAL HIGH
IgE (Immunoglobulin E), Serum: 53 kU/L (ref <=114)
Johnson Grass: 0.17 kU/L — ABNORMAL HIGH
Pecan/Hickory Tree IgE: 0.18 kU/L — ABNORMAL HIGH
Rough Pigweed  IgE: <0.1 kU/L
Sheep Sorrel IgE: <0.1 kU/L
Timothy Grass: 0.19 kU/L — ABNORMAL HIGH

## 2023-09-14 LAB — DOG DANDER COMPONENT
Can f 4(e229) IgE: <0.1 kU/L
E101-IgE Can f 1: <0.1 kU/L
E102-IgE Can f 2: <0.1 kU/L
E221-IgE Can f 3: <0.1 kU/L
E226-IgE Can f 5: 5.64 kU/L — ABNORMAL HIGH
E230-IGE CAN F 6: <0.1 kU/L

## 2023-09-14 LAB — INTERPRETATION:

## 2023-09-15 ENCOUNTER — Other Ambulatory Visit: Payer: Self-pay | Admitting: Nurse Practitioner

## 2023-09-15 NOTE — Telephone Encounter (Signed)
 Requesting: METFORMIN  ER 500MG  24HR TABS  Last Visit: 09/11/2023 Next Visit: Visit date not found Last Refill: 09/11/2023  Please Advise

## 2024-01-31 ENCOUNTER — Other Ambulatory Visit: Payer: Self-pay | Admitting: Nurse Practitioner

## 2024-01-31 MED ORDER — METFORMIN HCL ER 500 MG PO TB24: 2000.0000 mg | ORAL_TABLET | Freq: Every day | ORAL | 0 refills | Status: DC

## 2024-01-31 MED ORDER — PRENATAL 27-0.8 MG PO TABS: 1.0000 | ORAL_TABLET | Freq: Every day | ORAL | 0 refills | Status: DC

## 2024-01-31 NOTE — Telephone Encounter (Signed)
 I called and spoke with patient and notified her that prescriptions were approved and sent to her pharmacy.

## 2024-01-31 NOTE — Telephone Encounter (Signed)
 Requesting: Prenatal Vit-Fe Fumarate-FA (MULTIVITAMIN-PRENATAL) 27-0.8 MG TABS tablet, metFORMIN  (GLUCOPHAGE -XR) 500 MG 24 hr tablet  Last Visit: 09/11/2023 Next Visit: 02/16/2024 Last Refill: 03/13/2023, 09/11/2023  Please Advise

## 2024-01-31 NOTE — Telephone Encounter (Signed)
 Copied from CRM [id number]. Topic: Clinical - Medication Refill >> Jan 31, 2024 11:47 AM Roselie BROCKS wrote: Medication:  metFORMIN  (GLUCOPHAGE -XR) 500 MG , Prenatal Vit-Fe Fumarate-FA (MULTIVITAMIN-PRENATAL) 27-0.8 MG TABS tablet   Has the patient contacted their pharmacy? Yes (Agent: If no, request that the patient contact the pharmacy for the refill. If patient does not wish to contact the pharmacy document the reason why and proceed with request.) (Agent: If yes, when and what did the pharmacy advise?)  This is the patient's preferred pharmacy:  [id number] [address], [place] - [address] AT [facility] [address] [facility] [address] [place] [address] Phone: [phone] Fax: [phone]  Is this the correct pharmacy for this prescription? Yes If no, delete pharmacy and type the correct one.   Has the prescription been filled recently? No  Is the patient out of the medication? Yes  Has the patient been seen for an appointment in the last year OR does the patient have an upcoming appointment? Yes  Can we respond through MyChart? Yes  Agent: Please be advised that Rx refills may take up to 3 business days. We ask that you follow-up with your pharmacy.

## 2024-02-02 ENCOUNTER — Telehealth: Payer: Self-pay

## 2024-02-02 DIAGNOSIS — E119 Type 2 diabetes mellitus without complications: Secondary | ICD-10-CM

## 2024-02-02 DIAGNOSIS — E1169 Type 2 diabetes mellitus with other specified complication: Secondary | ICD-10-CM

## 2024-02-02 DIAGNOSIS — N911 Secondary amenorrhea: Secondary | ICD-10-CM

## 2024-02-02 DIAGNOSIS — Z3009 Encounter for other general counseling and advice on contraception: Secondary | ICD-10-CM

## 2024-02-02 DIAGNOSIS — N926 Irregular menstruation, unspecified: Secondary | ICD-10-CM

## 2024-02-02 NOTE — Telephone Encounter (Signed)
 Copied from CRM #[id number]. Topic: Clinical - Prescription Issue >> Feb 02, 2024  4:05 PM Drema MATSU wrote: Reason for CRM: Patient stated that the pharmacy where her prescription METFORMIN  and Prenatals was sent to is temporarily closed down. She is wanting to know if it can be sent to a different Pharmacy.

## 2024-02-02 NOTE — Telephone Encounter (Signed)
 Called and left a voice message asking patient to give the office a call back before 5 PM today 02/02/24 with a pharmacy she would like to have medications sent to.

## 2024-02-05 MED ORDER — PRENATAL 27-0.8 MG PO TABS: 1.0000 | ORAL_TABLET | Freq: Every day | ORAL | 0 refills | Status: DC

## 2024-02-05 MED ORDER — PRENATAL 27-0.8 MG PO TABS: 1.0000 | ORAL_TABLET | Freq: Every day | ORAL | 0 refills | Status: AC

## 2024-02-05 MED ORDER — METFORMIN HCL ER 500 MG PO TB24: 2000.0000 mg | ORAL_TABLET | Freq: Every day | ORAL | 0 refills | Status: DC

## 2024-02-05 NOTE — Telephone Encounter (Unsigned)
 Copied from CRM #[id number]. Topic: Clinical - Prescription Issue >> Feb 02, 2024  5:00 PM Leah W wrote: Reason for CRM: Pt called back to advise she needs rx to go to [facility] [address] [place], [place] [address]

## 2024-02-16 ENCOUNTER — Encounter: Payer: Self-pay | Admitting: Nurse Practitioner

## 2024-02-16 ENCOUNTER — Ambulatory Visit (INDEPENDENT_AMBULATORY_CARE_PROVIDER_SITE_OTHER): Admitting: Nurse Practitioner

## 2024-02-16 VITALS — BP 108/80 | HR 55 | Temp 96.9°F | Ht 62.0 in | Wt 151.6 lb

## 2024-02-16 DIAGNOSIS — E663 Overweight: Secondary | ICD-10-CM | POA: Diagnosis not present

## 2024-02-16 DIAGNOSIS — E119 Type 2 diabetes mellitus without complications: Secondary | ICD-10-CM | POA: Diagnosis not present

## 2024-02-16 DIAGNOSIS — Z0000 Encounter for general adult medical examination without abnormal findings: Secondary | ICD-10-CM | POA: Diagnosis not present

## 2024-02-16 DIAGNOSIS — E785 Hyperlipidemia, unspecified: Secondary | ICD-10-CM

## 2024-02-16 DIAGNOSIS — Z7984 Long term (current) use of oral hypoglycemic drugs: Secondary | ICD-10-CM

## 2024-02-16 DIAGNOSIS — E1169 Type 2 diabetes mellitus with other specified complication: Secondary | ICD-10-CM

## 2024-02-16 DIAGNOSIS — Z1231 Encounter for screening mammogram for malignant neoplasm of breast: Secondary | ICD-10-CM

## 2024-02-16 LAB — LIPID PANEL
Cholesterol: 179 mg/dL (ref 0–200)
HDL: 60.6 mg/dL (ref >39.00)
LDL Cholesterol: 107 mg/dL — ABNORMAL HIGH (ref 0–99)
NonHDL: 118.45
Total CHOL/HDL Ratio: 3
Triglycerides: 57 mg/dL (ref 0.0–149.0)
VLDL: 11.4 mg/dL (ref 0.0–40.0)

## 2024-02-16 LAB — COMPREHENSIVE METABOLIC PANEL WITH GFR
ALT: 10 U/L (ref 0–35)
AST: 16 U/L (ref 0–37)
Albumin: 4.8 g/dL (ref 3.5–5.2)
Alkaline Phosphatase: 41 U/L (ref 39–117)
BUN: 13 mg/dL (ref 6–23)
CO2: 28 meq/L (ref 19–32)
Calcium: 10 mg/dL (ref 8.4–10.5)
Chloride: 102 meq/L (ref 96–112)
Creatinine, Ser: 0.65 mg/dL (ref 0.40–1.20)
GFR: 110.46 mL/min (ref >60.00)
Glucose, Bld: 101 mg/dL — ABNORMAL HIGH (ref 70–99)
Potassium: 4.1 meq/L (ref 3.5–5.1)
Sodium: 139 meq/L (ref 135–145)
Total Bilirubin: 0.4 mg/dL (ref 0.2–1.2)
Total Protein: 8.2 g/dL (ref 6.0–8.3)

## 2024-02-16 LAB — CBC WITH DIFFERENTIAL/PLATELET
Basophils Absolute: 0 K/uL (ref 0.0–0.1)
Basophils Relative: 0.7 % (ref 0.0–3.0)
Eosinophils Absolute: 0.2 K/uL (ref 0.0–0.7)
Eosinophils Relative: 3.1 % (ref 0.0–5.0)
HCT: 43.3 % (ref 36.0–46.0)
Hemoglobin: 13.8 g/dL (ref 12.0–15.0)
Lymphocytes Relative: 30.4 % (ref 12.0–46.0)
Lymphs Abs: 1.6 K/uL (ref 0.7–4.0)
MCHC: 31.8 g/dL (ref 30.0–36.0)
MCV: 81.5 fl (ref 78.0–100.0)
Monocytes Absolute: 0.3 K/uL (ref 0.1–1.0)
Monocytes Relative: 6.7 % (ref 3.0–12.0)
Neutro Abs: 3.1 K/uL (ref 1.4–7.7)
Neutrophils Relative %: 59.1 % (ref 43.0–77.0)
Platelets: 210 K/uL (ref 150.0–400.0)
RBC: 5.31 Mil/uL — ABNORMAL HIGH (ref 3.87–5.11)
RDW: 13.2 % (ref 11.5–15.5)
WBC: 5.2 K/uL (ref 4.0–10.5)

## 2024-02-16 LAB — HEMOGLOBIN A1C: Hgb A1c MFr Bld: 7 % — ABNORMAL HIGH (ref 4.6–6.5)

## 2024-02-16 LAB — MICROALBUMIN / CREATININE URINE RATIO
Creatinine,U: 172.4 mg/dL
Microalb Creat Ratio: 6.6 mg/g (ref 0.0–30.0)
Microalb, Ur: 1.1 mg/dL (ref 0.0–1.9)

## 2024-02-16 NOTE — Assessment & Plan Note (Signed)
Chronic, stable.  Continue holding atorvastatin until after pregnancy.

## 2024-02-16 NOTE — Assessment & Plan Note (Signed)
 Health maintenance reviewed and updated. Discussed nutrition, exercise. Start prenatal vitamin daily. Follow-up 1 year.

## 2024-02-16 NOTE — Assessment & Plan Note (Signed)
 Chronic, ongoing. Her diabetes is managed with metformin , and blood glucose levels fluctuate between 70 and 120 mg/dL. Insulin will be considered if levels increase, with alternative medications available if not pregnant. Continue metformin  2,000mg  daily as prescribed and monitor blood glucose levels regularly. Check CMP, CBC, A1c, lipid panel and urine microalbumin today. Follow-up in 6 months.

## 2024-02-16 NOTE — Assessment & Plan Note (Signed)
 Continue metformin  XR 2,000mg  daily.

## 2024-02-16 NOTE — Patient Instructions (Signed)
 It was great to see you!  We are checking your labs today and will let you know the results via mychart/phone.   I have placed an order for screening mammogram, they should call you to schedule. If you do not hear from them in the next week, please call:  [facility] [address] [place], [address] [place], [place] [phone]  Let's follow-up in 6 months, sooner if you have concerns.  If a referral was placed today, you will be contacted for an appointment. Please note that routine referrals can sometimes take up to 3-4 weeks to process. Please call our office if you haven't heard anything after this time frame.  Take care,  Tinnie Harada, NP

## 2024-02-16 NOTE — Assessment & Plan Note (Signed)
 BMI 27.7. She has lost nine pounds through diet and exercise. Weight loss medications are not recommended while trying to conceive, but options like Ozempic or Mounjaro may be considered postpartum. Continue the current diet and exercise regimen. Reassess weight management options postpartum.

## 2024-02-16 NOTE — Progress Notes (Signed)
 BP 108/80 (BP Location: Left Arm, Patient Position: Sitting, Cuff Size: Normal)   Pulse (!) 55   Temp (!) 96.9 F (36.1 C)   Wt 151 lb 9.6 oz (68.8 kg)   LMP 02/02/2024 (Exact Date)   SpO2 98%   BMI 27.73 kg/m    Subjective:    Patient ID: Paula Delgado, female    DOB: Dec 30, 1985, 38 y.o.   MRN: [id number]  CC: Chief Complaint  Patient presents with   Annual Exam    With fasting labs, concerns with weight and discuss weight loss options    HPI: Paula Delgado is a 38 y.o. female presenting on 02/16/2024 for comprehensive medical examination. Current medical complaints include:weight, diabetes  Discussed the use of AI scribe software for clinical note transcription with the patient, who gave verbal consent to proceed.  She is actively trying to conceive and is aware that this week is her fertile window. Her partner's fatigue sometimes affects her ability to meet the recommended frequency of intercourse. She is not currently taking prenatal vitamins.  She has lost nine pounds since her last visit by attending the [facility] and reducing fried food intake. She did have cake for her birthday but is generally trying to eat healthier.  She is taking metformin  for diabetes, although she needs to pick up her prescription. Blood sugar levels fluctuate between 70 and 120 mg/dL, influenced by dietary choices. She experiences no chest pain or shortness of breath and notes improved ability to jog slowly on the treadmill. No numbness, tingling, dizziness, or headaches unless meals are skipped.    She currently lives with: husband Menopausal Symptoms: no  Depression and Anxiety Screen done today and results listed below:     02/16/2024   10:47 AM 03/13/2023    8:56 AM 07/06/2021   10:14 AM 07/06/2021    9:55 AM 03/06/2019    9:33 AM  Depression screen PHQ 2/9  Decreased Interest 0 1 1 0 0  Down, Depressed, Hopeless 0 1 1 0 0  PHQ - 2 Score 0 2 2 0 0  Altered sleeping 0 0 2    Tired,  decreased energy 0 0 2    Change in appetite 0 1 2    Feeling bad or failure about yourself  0 1 2    Trouble concentrating 0 0 1    Moving slowly or fidgety/restless 0 0 0    Suicidal thoughts 0 0 0    PHQ-9 Score 0 4 11    Difficult doing work/chores Not difficult at all Not difficult at all Somewhat difficult        02/16/2024   10:48 AM 03/13/2023    8:59 AM 07/06/2021   10:14 AM  GAD 7 : Generalized Anxiety Score  Nervous, Anxious, on Edge 0 0 1  Control/stop worrying 0 1 2  Worry too much - different things 0 1 1  Trouble relaxing 0 2 2  Restless 0 1 1  Easily annoyed or irritable 0 1 2  Afraid - awful might happen 0 0 0  Total GAD 7 Score 0 6 9  Anxiety Difficulty Not difficult at all Somewhat difficult Somewhat difficult    The patient does not have a history of falls. I did not complete a risk assessment for falls. A plan of care for falls was not documented.   Past Medical History:  Past Medical History:  Diagnosis Date   Anxiety    Diabetes mellitus without complication (HCC)  Hyperlipidemia     Surgical History:  History reviewed. No pertinent surgical history.  Medications:  Current Outpatient Medications on File Prior to Visit  Medication Sig   acetaminophen  (TYLENOL ) 325 MG tablet Take 2 tablets (650 mg total) by mouth every 6 (six) hours as needed. (Patient not taking: Reported on 09/11/2023)   Blood Glucose Monitoring Suppl (ONE TOUCH ULTRA 2) w/Device KIT 1 each by Does not apply route daily.   glucose blood (ONETOUCH ULTRA) test strip Use as instructed (Patient not taking: Reported on 09/11/2023)   Lancets (ONETOUCH ULTRASOFT) lancets Use as instructed (Patient not taking: Reported on 09/11/2023)   loratadine  (CLARITIN ) 10 MG tablet Take 1 tablet (10 mg total) by mouth daily.   metFORMIN  (GLUCOPHAGE -XR) 500 MG 24 hr tablet Take 4 tablets (2,000 mg total) by mouth daily with breakfast.   Prenatal Vit-Fe Fumarate-FA (MULTIVITAMIN-PRENATAL) 27-0.8 MG TABS  tablet Take 1 tablet by mouth daily at 12 noon.   No current facility-administered medications on file prior to visit.    Allergies:  No Known Allergies  Social History:  Social History   Socioeconomic History   Marital status: Married    Spouse name: Not on file   Number of children: Not on file   Years of education: Not on file   Highest education level: Not on file  Occupational History   Not on file  Tobacco Use   Smoking status: Never   Smokeless tobacco: Never  Vaping Use   Vaping status: Never Used  Substance and Sexual Activity   Alcohol use: Not Currently   Drug use: Never   Sexual activity: Yes  Other Topics Concern   Not on file  Social History Narrative   Not on file   Social Drivers of Health   Financial Resource Strain: Not on file  Food Insecurity: Not on file  Transportation Needs: Not on file  Physical Activity: Not on file  Stress: Not on file  Social Connections: Not on file  Intimate Partner Violence: Not on file   Social History   Tobacco Use  Smoking Status Never  Smokeless Tobacco Never   Social History   Substance and Sexual Activity  Alcohol Use Not Currently    Family History:  Family History  Problem Relation Age of Onset   Diabetes Mother     Past medical history, surgical history, medications, allergies, family history and social history reviewed with patient today and changes made to appropriate areas of the chart.   Review of Systems  Constitutional: Negative.   HENT: Negative.    Eyes: Negative.   Respiratory: Negative.    Cardiovascular: Negative.   Gastrointestinal: Negative.   Genitourinary: Negative.   Musculoskeletal: Negative.   Skin: Negative.   Neurological: Negative.   Psychiatric/Behavioral: Negative.     All other ROS negative except what is listed above and in the HPI.      Objective:    BP 108/80 (BP Location: Left Arm, Patient Position: Sitting, Cuff Size: Normal)   Pulse (!) 55   Temp (!)  96.9 F (36.1 C)   Wt 151 lb 9.6 oz (68.8 kg)   LMP 02/02/2024 (Exact Date)   SpO2 98%   BMI 27.73 kg/m   Wt Readings from Last 3 Encounters:  02/16/24 151 lb 9.6 oz (68.8 kg)  09/11/23 160 lb 12.8 oz (72.9 kg)  08/13/23 161 lb 13.1 oz (73.4 kg)    Physical Exam Vitals and nursing note reviewed.  Constitutional:  General: She is not in acute distress.    Appearance: Normal appearance.  HENT:     Head: Normocephalic and atraumatic.     Right Ear: Tympanic membrane, ear canal and external ear normal.     Left Ear: Tympanic membrane, ear canal and external ear normal.     Mouth/Throat:     Mouth: Mucous membranes are moist.     Pharynx: No posterior oropharyngeal erythema.  Eyes:     Conjunctiva/sclera: Conjunctivae normal.  Cardiovascular:     Rate and Rhythm: Normal rate and regular rhythm.     Pulses: Normal pulses.     Heart sounds: Normal heart sounds.  Pulmonary:     Effort: Pulmonary effort is normal.     Breath sounds: Normal breath sounds.  Abdominal:     Palpations: Abdomen is soft.     Tenderness: There is no abdominal tenderness.  Musculoskeletal:        General: Normal range of motion.     Cervical back: Normal range of motion and neck supple.     Right lower leg: No edema.     Left lower leg: No edema.  Lymphadenopathy:     Cervical: No cervical adenopathy.  Skin:    General: Skin is warm and dry.  Neurological:     General: No focal deficit present.     Mental Status: She is alert and oriented to person, place, and time.     Cranial Nerves: No cranial nerve deficit.     Coordination: Coordination normal.     Gait: Gait normal.  Psychiatric:        Mood and Affect: Mood normal.        Behavior: Behavior normal.        Thought Content: Thought content normal.        Judgment: Judgment normal.     Results for orders placed or performed in visit on 09/11/23  Basic metabolic panel with GFR   Collection Time: 09/11/23  9:33 AM  Result Value Ref  Range   Sodium 136 135 - 145 mEq/L   Potassium 4.0 3.5 - 5.1 mEq/L   Chloride 102 96 - 112 mEq/L   CO2 25 19 - 32 mEq/L   Glucose, Bld 132 (H) 70 - 99 mg/dL   BUN 17 6 - 23 mg/dL   Creatinine, Ser 9.35 0.40 - 1.20 mg/dL   GFR 888.78 >39.99 mL/min   Calcium  9.6 8.4 - 10.5 mg/dL  Hemoglobin J8r   Collection Time: 09/11/23  9:33 AM  Result Value Ref Range   Hgb A1c MFr Bld 8.1 (H) 4.6 - 6.5 %  TSH   Collection Time: 09/11/23  9:33 AM  Result Value Ref Range   TSH 1.25 0.35 - 5.50 uIU/mL  Resp Allergy  Profile Regn2DC DE MD [facility]   Collection Time: 09/11/23  9:33 AM  Result Value Ref Range   Allergen, D pternoyssinus,d7 <0.10 kU/L   Class 0    D. farinae <0.10 kU/L   Class 0    Allergen, P. notatum, m1 <0.10 kU/L   Class 0    CLADOSPORIUM HERBARUM (M2) IGE <0.10 kU/L   Class 0    Aspergillus fumigatus, m3 <0.10 kU/L   Class 0    Allergen, A. alternata, m6 <0.10 kU/L   Class 0    Cat Dander <0.10 kU/L   Class 0    Dog Dander 2.20 (H) kU/L   Class 2    Cockroach <0.10 kU/L  Class 0    [place] IgE 0.21 (H) kU/L   Class 0/1    Allergen, Comm Silver [place]rie, t9 0.26 (H) kU/L   Class 0/1    Allergen, Cedar tree, t12 0.18 (H) kU/L   Class 0/1    Allergen, Cottonwood, t14 0.25 (H) kU/L   Class 0/1    Allergen, Oak,t7 0.21 (H) kU/L   Class 0/1    Elm IgE 0.26 (H) kU/L   Class 0/1    Pecan/Hickory Tree IgE 0.18 (H) kU/L   Class 0/1    Allergen, Mulberry, t76 0.15 (H) kU/L   Class 0/1    [place] Grass 0.16 (H) kU/L   Class 0/1    Timothy Grass 0.19 (H) kU/L   Class 0/1    [place] Grass 0.17 (H) kU/L   Class 0/1    COMMON RAGWEED (SHORT) (W1) IGE 0.19 (H) kU/L   Class 0/1    Rough Pigweed  IgE <0.10 kU/L   Class 0    Sheep Sorrel IgE <0.10 kU/L   Class 0    Allergen, Mouse Urine Protein, e78 <0.10 kU/L   Class 0    IgE (Immunoglobulin E), Serum 53 <OR=114 kU/L  Interpretation:   Collection Time: 09/11/23  9:33 AM  Result Value Ref Range   Interpretation     Reflex DOG DANDER COMPONENT   Collection Time: 09/11/23  9:33 AM  Result Value Ref Range   E101-IgE Can f 1 <0.10 <0.10 kU/L   E102-IgE Can f 2 <0.10 <0.10 kU/L   E221-IgE Can f 3 <0.10 <0.10 kU/L   Can f 4(e229) IgE <0.10 <0.10 kU/L   E226-IgE Can f 5 5.64 (H) <0.10 kU/L   Can f 6(e230) IgE <0.10 <0.10 kU/L      Assessment & Plan:   Problem List Items Addressed This Visit       Endocrine   Diabetes mellitus without complication (HCC)   Chronic, ongoing. Her diabetes is managed with metformin , and blood glucose levels fluctuate between 70 and 120 mg/dL. Insulin will be considered if levels increase, with alternative medications available if not pregnant. Continue metformin  2,000mg  daily as prescribed and monitor blood glucose levels regularly. Check CMP, CBC, A1c, lipid panel and urine microalbumin today. Follow-up in 6 months.       Relevant Orders   CBC with Differential/Platelet   Comprehensive metabolic panel with GFR   Lipid panel   Hemoglobin A1c   Microalbumin / creatinine urine ratio   Hyperlipidemia associated with type 2 diabetes mellitus (HCC)   Chronic, stable.  Continue holding atorvastatin  until after pregnancy.        Other   Long term current use of oral hypoglycemic drug   Continue metformin  XR 2,000mg  daily.       Routine general medical examination at a health care facility - Primary   Health maintenance reviewed and updated. Discussed nutrition, exercise. Start prenatal vitamin daily. Follow-up 1 year.        Overweight (BMI 25.0-29.9)   BMI 27.7. She has lost nine pounds through diet and exercise. Weight loss medications are not recommended while trying to conceive, but options like Ozempic or Mounjaro may be considered postpartum. Continue the current diet and exercise regimen. Reassess weight management options postpartum.       Other Visit Diagnoses       Encounter for screening mammogram for malignant neoplasm of breast       Mammogram  ordered today - do not get if  pregnant   Relevant Orders   MM 3D SCREENING MAMMOGRAM BILATERAL BREAST       Follow up plan: Return in about 6 months (around 08/16/2024) for Diabetes.   LABORATORY TESTING:  - Pap smear: up to date  IMMUNIZATIONS:   - Tdap: Tetanus vaccination status reviewed: last tetanus booster within 10 years. - Influenza: Declined - Pneumovax: Not applicable - Prevnar: Up to date - HPV: Declined - Shingrix vaccine: Not applicable  SCREENING: -Mammogram: Ordered today  - Colonoscopy: Not applicable  - Bone Density: Not applicable   PATIENT COUNSELING:   Advised to take 1 mg of folate supplement per day if capable of pregnancy.   Sexuality: Discussed sexually transmitted diseases, partner selection, use of condoms, avoidance of unintended pregnancy  and contraceptive alternatives.   Advised to avoid cigarette smoking.  I discussed with the patient that most people either abstain from alcohol or drink within safe limits (<=14/week and <=4 drinks/occasion for males, <=7/weeks and <= 3 drinks/occasion for females) and that the risk for alcohol disorders and other health effects rises proportionally with the number of drinks per week and how often a drinker exceeds daily limits.  Discussed cessation/primary prevention of drug use and availability of treatment for abuse.   Diet: Encouraged to adjust caloric intake to maintain  or achieve ideal body weight, to reduce intake of dietary saturated fat and total fat, to limit sodium intake by avoiding high sodium foods and not adding table salt, and to maintain adequate dietary potassium and calcium  preferably from fresh fruits, vegetables, and low-fat dairy products.    stressed the importance of regular exercise  Injury prevention: Discussed safety belts, safety helmets, smoke detector, smoking near bedding or upholstery.   Dental health: Discussed importance of regular tooth brushing, flossing, and dental visits.     NEXT PREVENTATIVE PHYSICAL DUE IN 1 YEAR. Return in about 6 months (around 08/16/2024) for Diabetes.  Maysoon Lozada A Alizabeth Antonio

## 2024-02-19 ENCOUNTER — Ambulatory Visit: Payer: Self-pay | Admitting: Nurse Practitioner

## 2024-03-20 ENCOUNTER — Ambulatory Visit

## 2024-04-10 ENCOUNTER — Ambulatory Visit
Admission: RE | Admit: 2024-04-10 | Discharge: 2024-04-10 | Disposition: A | Discharge status: Home / Self Care | Source: Ambulatory Visit | Attending: Nurse Practitioner | Admitting: Nurse Practitioner

## 2024-04-10 DIAGNOSIS — Z1231 Encounter for screening mammogram for malignant neoplasm of breast: Secondary | ICD-10-CM

## 2024-04-17 ENCOUNTER — Other Ambulatory Visit: Payer: Self-pay | Admitting: Nurse Practitioner

## 2024-04-17 DIAGNOSIS — R928 Other abnormal and inconclusive findings on diagnostic imaging of breast: Secondary | ICD-10-CM

## 2024-04-23 ENCOUNTER — Inpatient Hospital Stay
Admission: RE | Admit: 2024-04-23 | Discharge: 2024-04-23 | Attending: Nurse Practitioner | Admitting: Nurse Practitioner

## 2024-04-23 ENCOUNTER — Other Ambulatory Visit: Payer: Self-pay | Admitting: Nurse Practitioner

## 2024-04-23 DIAGNOSIS — R921 Mammographic calcification found on diagnostic imaging of breast: Secondary | ICD-10-CM

## 2024-04-23 DIAGNOSIS — R928 Other abnormal and inconclusive findings on diagnostic imaging of breast: Secondary | ICD-10-CM

## 2024-04-24 ENCOUNTER — Ambulatory Visit: Payer: Self-pay | Admitting: Nurse Practitioner

## 2024-05-06 ENCOUNTER — Other Ambulatory Visit: Payer: Self-pay | Admitting: Nurse Practitioner

## 2024-05-06 DIAGNOSIS — E785 Hyperlipidemia, unspecified: Secondary | ICD-10-CM

## 2024-05-06 DIAGNOSIS — E119 Type 2 diabetes mellitus without complications: Secondary | ICD-10-CM

## 2024-05-07 NOTE — Telephone Encounter (Signed)
 Requesting: METFORMIN  ER 500MG  24HR TABS  Last Visit: 02/16/2024 Next Visit: 08/15/2024 Last Refill: 02/05/2024  Please Advise

## 2024-08-15 ENCOUNTER — Ambulatory Visit: Admitting: Nurse Practitioner

## 2024-10-24 ENCOUNTER — Encounter
